# Patient Record
Sex: Male | Born: 1970 | Race: White | Hispanic: No | Marital: Married | Smoking: Never smoker
Health system: Southern US, Community
[De-identification: ages and names within clinical notes are randomized; demographics above are authoritative.]

## PROBLEM LIST (undated history)

## (undated) DIAGNOSIS — N200 Calculus of kidney: Secondary | ICD-10-CM

## (undated) DIAGNOSIS — K760 Fatty (change of) liver, not elsewhere classified: Secondary | ICD-10-CM

## (undated) DIAGNOSIS — Z87442 Personal history of urinary calculi: Secondary | ICD-10-CM

## (undated) DIAGNOSIS — G4730 Sleep apnea, unspecified: Secondary | ICD-10-CM

## (undated) DIAGNOSIS — T7840XA Allergy, unspecified, initial encounter: Secondary | ICD-10-CM

## (undated) DIAGNOSIS — F32A Depression, unspecified: Secondary | ICD-10-CM

## (undated) DIAGNOSIS — K8020 Calculus of gallbladder without cholecystitis without obstruction: Secondary | ICD-10-CM

## (undated) DIAGNOSIS — F429 Obsessive-compulsive disorder, unspecified: Secondary | ICD-10-CM

## (undated) DIAGNOSIS — R7303 Prediabetes: Secondary | ICD-10-CM

## (undated) DIAGNOSIS — K8050 Calculus of bile duct without cholangitis or cholecystitis without obstruction: Secondary | ICD-10-CM

## (undated) DIAGNOSIS — J309 Allergic rhinitis, unspecified: Secondary | ICD-10-CM

## (undated) DIAGNOSIS — G4733 Obstructive sleep apnea (adult) (pediatric): Secondary | ICD-10-CM

## (undated) DIAGNOSIS — F329 Major depressive disorder, single episode, unspecified: Secondary | ICD-10-CM

## (undated) DIAGNOSIS — F419 Anxiety disorder, unspecified: Secondary | ICD-10-CM

## (undated) DIAGNOSIS — IMO0001 Reserved for inherently not codable concepts without codable children: Secondary | ICD-10-CM

## (undated) DIAGNOSIS — E559 Vitamin D deficiency, unspecified: Secondary | ICD-10-CM

## (undated) HISTORY — DX: Allergy, unspecified, initial encounter: T78.40XA

## (undated) HISTORY — DX: Calculus of bile duct without cholangitis or cholecystitis without obstruction: K80.50

## (undated) HISTORY — DX: Allergic rhinitis, unspecified: J30.9

## (undated) HISTORY — DX: Vitamin D deficiency, unspecified: E55.9

## (undated) HISTORY — PX: LITHOTRIPSY: SUR834

## (undated) HISTORY — DX: Reserved for inherently not codable concepts without codable children: IMO0001

## (undated) HISTORY — DX: Calculus of kidney: N20.0

## (undated) HISTORY — DX: Fatty (change of) liver, not elsewhere classified: K76.0

## (undated) HISTORY — DX: Prediabetes: R73.03

## (undated) HISTORY — DX: Obstructive sleep apnea (adult) (pediatric): G47.33

## (undated) HISTORY — DX: Calculus of gallbladder without cholecystitis without obstruction: K80.20

## (undated) HISTORY — PX: INTERNAL URETHROTOMY: SHX1835

---

## 2005-07-18 ENCOUNTER — Ambulatory Visit: Payer: Self-pay | Admitting: Otolaryngology

## 2013-12-04 DIAGNOSIS — R251 Tremor, unspecified: Secondary | ICD-10-CM | POA: Insufficient documentation

## 2014-11-16 ENCOUNTER — Ambulatory Visit
Admit: 2014-11-16 | Disposition: A | Discharge status: Home / Self Care | Payer: Self-pay | Attending: Family Medicine | Admitting: Family Medicine

## 2014-11-16 LAB — URINALYSIS, COMPLETE
Bilirubin,UR: NEGATIVE
GLUCOSE, UR: NEGATIVE
Ketone: NEGATIVE
Nitrite: NEGATIVE
PROTEIN: NEGATIVE
Ph: 5.5 (ref 5.0–8.0)
Specific Gravity: 1.015 (ref 1.000–1.030)
Squamous Epithelial: NONE SEEN
WBC UR: >30 /HPF (ref 0–5)

## 2014-11-18 LAB — URINE CULTURE

## 2014-12-12 ENCOUNTER — Encounter: Payer: Self-pay | Admitting: Emergency Medicine

## 2014-12-12 ENCOUNTER — Ambulatory Visit
Admission: EM | Admit: 2014-12-12 | Discharge: 2014-12-12 | Disposition: A | Discharge status: Home / Self Care | Payer: 59 | Attending: Family Medicine | Admitting: Family Medicine

## 2014-12-12 DIAGNOSIS — Z8249 Family history of ischemic heart disease and other diseases of the circulatory system: Secondary | ICD-10-CM | POA: Diagnosis not present

## 2014-12-12 DIAGNOSIS — R312 Other microscopic hematuria: Secondary | ICD-10-CM

## 2014-12-12 DIAGNOSIS — N390 Urinary tract infection, site not specified: Secondary | ICD-10-CM

## 2014-12-12 DIAGNOSIS — Z79899 Other long term (current) drug therapy: Secondary | ICD-10-CM | POA: Insufficient documentation

## 2014-12-12 DIAGNOSIS — B3749 Other urogenital candidiasis: Secondary | ICD-10-CM

## 2014-12-12 DIAGNOSIS — M549 Dorsalgia, unspecified: Secondary | ICD-10-CM | POA: Diagnosis present

## 2014-12-12 DIAGNOSIS — R3129 Other microscopic hematuria: Secondary | ICD-10-CM

## 2014-12-12 DIAGNOSIS — R350 Frequency of micturition: Secondary | ICD-10-CM | POA: Diagnosis present

## 2014-12-12 HISTORY — DX: Depression, unspecified: F32.A

## 2014-12-12 HISTORY — DX: Obsessive-compulsive disorder, unspecified: F42.9

## 2014-12-12 HISTORY — DX: Anxiety disorder, unspecified: F41.9

## 2014-12-12 HISTORY — DX: Sleep apnea, unspecified: G47.30

## 2014-12-12 HISTORY — DX: Major depressive disorder, single episode, unspecified: F32.9

## 2014-12-12 LAB — URINALYSIS COMPLETE WITH MICROSCOPIC (ARMC ONLY)
Bilirubin Urine: NEGATIVE
Glucose, UA: NEGATIVE mg/dL
Ketones, ur: NEGATIVE mg/dL
NITRITE: POSITIVE — AB
PH: 5.5 (ref 5.0–8.0)
PROTEIN: NEGATIVE mg/dL
Specific Gravity, Urine: 1.02 (ref 1.005–1.030)

## 2014-12-12 MED ORDER — CIPROFLOXACIN HCL 500 MG PO TABS: 500.0000 mg | ORAL_TABLET | Freq: Two times a day (BID) | ORAL | Status: DC

## 2014-12-12 MED ORDER — FLUCONAZOLE 150 MG PO TABS: 150.0000 mg | ORAL_TABLET | Freq: Once | ORAL | Status: DC

## 2014-12-12 NOTE — ED Notes (Signed)
Patient c/o lower back pain and urinary urgency and frequency for a month.  Patient last dose of antibiotic was a week ago.  Patient denies fevers.  Patient denies N/V.

## 2014-12-12 NOTE — Discharge Instructions (Signed)
Antibiotic Medication Antibiotic medicine helps fight germs. Germs cause infections. This type of medicine will not work for colds, flu, or other viral infections. Tell your doctor if you:  Are allergic to any medicines.  Are pregnant or are trying to get pregnant.  Are taking other medicines.  Have other medical problems. HOME CARE  Take your medicine with a glass of water or food as told by your doctor.  Take the medicine as told. Finish them even if you start to feel better.  Do not give your medicine to other people.  Do not use your medicine in the future for a different infection.  Ask your doctor about which side effects to watch for.  Try not to miss any doses. If you miss a dose, take it as soon as possible. If it is almost time for your next dose, and your dosing schedule is:  Two doses a day, take the missed dose and the next dose 5 to 6 hours later.  Three or more doses a day, take the missed dose and the next dose 2 to 4 hours later, or double your next dose.  Then go back to your normal schedule. GET HELP RIGHT AWAY IF:   You get worse or do not get better within a few days.  The medicine makes you sick.  You develop a rash or any other side effects.  You have questions or concerns. MAKE SURE YOU:  Understand these instructions.  Will watch your condition.  Will get help right away if you are not doing well or get worse. Document Released: 05/04/2008 Document Revised: 10/18/2011 Document Reviewed: 07/01/2009 [facility] Patient Information 2015 [place], [place]. This information is not intended to replace advice given to you by your health care provider. Make sure you discuss any questions you have with your health care provider. Candida Infection A Candida infection (also called yeast, fungus, and Monilia infection) is an overgrowth of yeast that can occur anywhere on the body. A yeast infection commonly occurs in warm, moist body areas. Usually, the  infection remains localized but can spread to become a systemic infection. A yeast infection may be a sign of a more severe disease such as diabetes, leukemia, or AIDS. A yeast infection can occur in both men and women. In women, Candida vaginitis is a vaginal infection. It is one of the most common causes of vaginitis. Men usually do not have symptoms or know they have an infection until other problems develop. Men may find out they have a yeast infection because their sex partner has a yeast infection. Uncircumcised men are more likely to get a yeast infection than circumcised men. This is because the uncircumcised glans is not exposed to air and does not remain as dry as that of a circumcised glans. Older adults may develop yeast infections around dentures. CAUSES  Women Antibiotics. Steroid medication taken for a long time. Being overweight (obese). Diabetes. Poor immune condition. Certain serious medical conditions. Immune suppressive medications for organ transplant patients. Chemotherapy. Pregnancy. Menstruation. Stress and fatigue. Intravenous drug use. Oral contraceptives. Wearing tight-fitting clothes in the crotch area. Catching it from a sex partner who has a yeast infection. Spermicide. Intravenous, urinary, or other catheters. Men Catching it from a sex partner who has a yeast infection. Having oral or anal sex with a person who has the infection. Spermicide. Diabetes. Antibiotics. Poor immune system. Medications that suppress the immune system. Intravenous drug use. Intravenous, urinary, or other catheters. SYMPTOMS  Women Thick, white vaginal  discharge. Vaginal itching. Redness and swelling in and around the vagina. Irritation of the lips of the vagina and perineum. Blisters on the vaginal lips and perineum. Painful sexual intercourse. Low blood sugar (hypoglycemia). Painful urination. Bladder infections. Intestinal problems such as constipation, indigestion,  bad breath, bloating, increase in gas, diarrhea, or loose stools. Men Men may develop intestinal problems such as constipation, indigestion, bad breath, bloating, increase in gas, diarrhea, or loose stools. Dry, cracked skin on the penis with itching or discomfort. Jock itch. Dry, flaky skin. Athlete's foot. Hypoglycemia. DIAGNOSIS  Women A history and an exam are performed. The discharge may be examined under a microscope. A culture may be taken of the discharge. Men A history and an exam are performed. Any discharge from the penis or areas of cracked skin will be looked at under the microscope and cultured. Stool samples may be cultured. TREATMENT  Women Vaginal antifungal suppositories and creams. Medicated creams to decrease irritation and itching on the outside of the vagina. Warm compresses to the perineal area to decrease swelling and discomfort. Oral antifungal medications. Medicated vaginal suppositories or cream for repeated or recurrent infections. Wash and dry the irritation areas before applying the cream. Eating yogurt with Lactobacillus may help with prevention and treatment. Sometimes painting the vagina with gentian violet solution may help if creams and suppositories do not work. Men Antifungal creams and oral antifungal medications. Sometimes treatment must continue for 30 days after the symptoms go away to prevent recurrence. HOME CARE INSTRUCTIONS  Women Use cotton underwear and avoid tight-fitting clothing. Avoid colored, scented toilet paper and deodorant tampons or pads. Do not douche. Keep your diabetes under control. Finish all the prescribed medications. Keep your skin clean and dry. Consume milk or yogurt with Lactobacillus-active culture regularly. If you get frequent yeast infections and think that is what the infection is, there are over-the-counter medications that you can get. If the infection does not show healing in 3 days, talk to your  caregiver. Tell your sex partner you have a yeast infection. Your partner may need treatment also, especially if your infection does not clear up or recurs. Men Keep your skin clean and dry. Keep your diabetes under control. Finish all prescribed medications. Tell your sex partner that you have a yeast infection so he or she can be treated if necessary. SEEK MEDICAL CARE IF:  Your symptoms do not clear up or worsen in one week after treatment. You have an oral temperature above 102 F (38.9 C). You have trouble swallowing or eating for a prolonged time. You develop blisters on and around your vagina. You develop vaginal bleeding and it is not your menstrual period. You develop abdominal pain. You develop intestinal problems as mentioned above. You get weak or light-headed. You have painful or increased urination. You have pain during sexual intercourse. MAKE SURE YOU:  Understand these instructions. Will watch your condition. Will get help right away if you are not doing well or get worse. Document Released: 09/02/2004 Document Revised: 12/10/2013 Document Reviewed: 12/15/2009 [facility] Patient Information 2015 [place], [place]. This information is not intended to replace advice given to you by your health care provider. Make sure you discuss any questions you have with your health care provider. Hematuria Hematuria is blood in your urine. It can be caused by a bladder infection, kidney infection, prostate infection, kidney stone, or cancer of your urinary tract. Infections can usually be treated with medicine, and a kidney stone usually will pass through your urine. If  neither of these is the cause of your hematuria, further workup to find out the reason may be needed. It is very important that you tell your health care provider about any blood you see in your urine, even if the blood stops without treatment or happens without causing pain. Blood in your urine that happens and then stops  and then happens again can be a symptom of a very serious condition. Also, pain is not a symptom in the initial stages of many urinary cancers. HOME CARE INSTRUCTIONS  Drink lots of fluid, 3-4 quarts a day. If you have been diagnosed with an infection, cranberry juice is especially recommended, in addition to large amounts of water. Avoid caffeine, tea, and carbonated beverages because they tend to irritate the bladder. Avoid alcohol because it may irritate the prostate. Take all medicines as directed by your health care provider. If you were prescribed an antibiotic medicine, finish it all even if you start to feel better. If you have been diagnosed with a kidney stone, follow your health care provider's instructions regarding straining your urine to catch the stone. Empty your bladder often. Avoid holding urine for long periods of time. After a bowel movement, women should cleanse front to back. Use each tissue only once. Empty your bladder before and after sexual intercourse if you are a male. SEEK MEDICAL CARE IF: You develop back pain. You have a fever. You have a feeling of sickness in your stomach (nausea) or vomiting. Your symptoms are not better in 3 days. Return sooner if you are getting worse. SEEK IMMEDIATE MEDICAL CARE IF:  You develop severe vomiting and are unable to keep the medicine down. You develop severe back or abdominal pain despite taking your medicines. You begin passing a large amount of blood or clots in your urine. You feel extremely weak or faint, or you pass out. MAKE SURE YOU:  Understand these instructions. Will watch your condition. Will get help right away if you are not doing well or get worse. Document Released: 07/26/2005 Document Revised: 12/10/2013 Document Reviewed: 03/26/2013 [facility] Patient Information 2015 [place], [place]. This information is not intended to replace advice given to you by your health care provider. Make sure you discuss any  questions you have with your health care provider.

## 2014-12-12 NOTE — ED Provider Notes (Signed)
CSN: [id number]     Arrival date & time 12/12/14  0815 History   First MD Initiated Contact with Patient 12/12/14 [phone]     Chief Complaint  Patient presents with  . Back Pain  . Urinary Frequency   (Consider location/radiation/quality/duration/timing/severity/associated sxs/prior Treatment) Patient is a 44 y.o. male presenting with back pain and frequency. The history is provided by the patient. No language interpreter was used.  Back Pain Quality:  Aching Radiates to:  Does not radiate Pain is:  Same all the time Onset quality:  Unable to specify Progression:  Worsening Relieved by:  Nothing Associated symptoms: dysuria   Urinary Frequency This is a recurrent problem. The current episode started more than 1 week ago. The problem has not changed since onset.Nothing relieves the symptoms. He has tried nothing for the symptoms. The treatment provided no relief.    Patient was treated w/antibiotic earlier last month. He states it worked for a few days and then it returned.  He has had a HX of recurrent kidney stones and is iunder a Insurance underwriterUrologist care at this time . He has had multiple CT scans in the past as well.  Past Medical History  Diagnosis Date  . Anxiety   . Depression   . OCD (obsessive compulsive disorder)   . Sleep apnea   . Kidney stones    History reviewed. No pertinent past surgical history. Family History  Problem Relation Age of Onset  . Osteoarthritis Mother   . Hyperlipidemia Father   . Hypertension Father   . Osteoarthritis Father    History  Substance Use Topics  . Smoking status: Never Smoker   . Smokeless tobacco: Never Used  . Alcohol Use: Yes    Review of Systems  Genitourinary: Positive for dysuria, urgency, frequency, hematuria and difficulty urinating.  Musculoskeletal: Positive for back pain.  Skin: Negative for color change.    Allergies  Septra  Home Medications   Prior to Admission medications   Medication Sig Start Date End Date Taking?  Authorizing Provider  ARIPiprazole (ABILIFY) 10 MG tablet Take 10 mg by mouth daily.   Yes Historical Provider, MD  cetirizine (ZYRTEC) 10 MG tablet Take 10 mg by mouth daily.   Yes Historical Provider, MD  clonazePAM (KLONOPIN) 1 MG tablet Take 1 mg by mouth 2 (two) times daily.   Yes Historical Provider, MD  fluvoxaMINE (LUVOX) 100 MG tablet Take 100 mg by mouth 2 (two) times daily.   Yes Historical Provider, MD  hydrochlorothiazide (HYDRODIURIL) 12.5 MG tablet Take 12.5 mg by mouth daily.   Yes Historical Provider, MD  nortriptyline (PAMELOR) 25 MG capsule Take 25 mg by mouth 2 (two) times daily.   Yes Historical Provider, MD  ciprofloxacin (CIPRO) 500 MG tablet Take 1 tablet (500 mg total) by mouth 2 (two) times daily. 12/12/14   Hassan RowanEugene Courtlynn Holloman, MD  ciprofloxacin (CIPRO) 500 MG tablet Take 1 tablet (500 mg total) by mouth 2 (two) times daily. 12/12/14   Hassan RowanEugene Zurii Hewes, MD  fluconazole (DIFLUCAN) 150 MG tablet Take 1 tablet (150 mg total) by mouth once. Repeat in 1 week 12/12/14   Hassan RowanEugene Abygayle Deltoro, MD  fluconazole (DIFLUCAN) 150 MG tablet Take 1 tablet (150 mg total) by mouth once. Repeat in 1 week 12/12/14   Hassan RowanEugene Dorathy Stallone, MD   BP 101/70 mmHg  Pulse 110  Temp(Src) 98.5 F (36.9 C) (Tympanic)  Resp 16  Ht 5\' 10"  (1.778 m)  Wt 256 lb (116.121 kg)  BMI 36.73 kg/m2  SpO2 99% Physical Exam  Constitutional: He is oriented to person, place, and time. He appears well-developed and well-nourished. No distress.  Neck: Neck supple.  Musculoskeletal: Normal range of motion. He exhibits no tenderness.  No CVA tenderness noted.  Neurological: He is alert and oriented to person, place, and time.  Skin: Skin is warm. No rash noted. He is not diaphoretic. No erythema.  Psychiatric: He has a normal mood and affect.  Vitals reviewed.  ED Course  Procedures (including critical care time) Labs Review Labs Reviewed  URINALYSIS COMPLETEWITH MICROSCOPIC [facility])  - Abnormal; Notable for the following:    APPearance HAZY  (*)    Hgb urine dipstick 3+ (*)    Nitrite POSITIVE (*)    Leukocytes, UA 2+ (*)    Bacteria, UA MANY (*)    Squamous Epithelial / LPF 0-5 (*)    All other components within normal limits  URINE CULTURE    Imaging Review No results found. Results for orders placed or performed during the hospital encounter of 12/12/14  Urinalysis complete, with microscopic  Result Value Ref Range   Color, Urine YELLOW YELLOW   APPearance HAZY (A) CLEAR   Glucose, UA NEGATIVE NEGATIVE mg/dL   Bilirubin Urine NEGATIVE NEGATIVE   Ketones, ur NEGATIVE NEGATIVE mg/dL   Specific Gravity, Urine 1.020 1.005 - 1.030   Hgb urine dipstick 3+ (A) NEGATIVE   pH 5.5 5.0 - 8.0   Protein, ur NEGATIVE NEGATIVE mg/dL   Nitrite POSITIVE (A) NEGATIVE   Leukocytes, UA 2+ (A) NEGATIVE   RBC / HPF 6-30 <3 RBC/hpf   WBC, UA 6-30 <3 WBC/hpf   Bacteria, UA MANY (A) RARE   Squamous Epithelial / LPF 0-5 (A) RARE   Budding Yeast PRESENT    WBC Casts, UA PRESENT    Urine also has yeast present as well.  MDM   1. Acute urinary tract infection   2. Hematuria, microscopic   3. Candidal urinary tract infection        Hassan RowanEugene Delwyn Scoggin, MD 12/12/14 1715

## 2014-12-14 LAB — URINE CULTURE: Special Requests: NORMAL

## 2015-07-20 ENCOUNTER — Ambulatory Visit
Admission: EM | Admit: 2015-07-20 | Discharge: 2015-07-20 | Disposition: A | Discharge status: Home / Self Care | Payer: 59 | Attending: Family Medicine | Admitting: Family Medicine

## 2015-07-20 DIAGNOSIS — J069 Acute upper respiratory infection, unspecified: Secondary | ICD-10-CM

## 2015-07-20 DIAGNOSIS — J40 Bronchitis, not specified as acute or chronic: Secondary | ICD-10-CM | POA: Diagnosis not present

## 2015-07-20 MED ORDER — HYDROCOD POLST-CPM POLST ER 10-8 MG/5ML PO SUER: 5.0000 mL | Freq: Two times a day (BID) | ORAL | Status: DC | PRN

## 2015-07-20 MED ORDER — FEXOFENADINE-PSEUDOEPHED ER 180-240 MG PO TB24: 1.0000 | ORAL_TABLET | Freq: Every day | ORAL | Status: DC

## 2015-07-20 MED ORDER — AZITHROMYCIN 250 MG PO TABS: ORAL_TABLET | ORAL | Status: DC

## 2015-07-20 NOTE — Discharge Instructions (Signed)
Cough, Adult °Coughing is a reflex that clears your throat and your airways. Coughing helps to heal and protect your lungs. It is normal to cough occasionally, but a cough that happens with other symptoms or lasts a long time may be a sign of a condition that needs treatment. A cough may last only 2-3 weeks (acute), or it may last longer than 8 weeks (chronic). °CAUSES °Coughing is commonly caused by: °· Breathing in substances that irritate your lungs. °· A viral or bacterial respiratory infection. °· Allergies. °· Asthma. °· Postnasal drip. °· Smoking. °· Acid backing up from the stomach into the esophagus (gastroesophageal reflux). °· Certain medicines. °· Chronic lung problems, including COPD (or rarely, lung cancer). °· Other medical conditions such as heart failure. °HOME CARE INSTRUCTIONS  °Pay attention to any changes in your symptoms. Take these actions to help with your discomfort: °· Take medicines only as told by your health care provider. °· If you were prescribed an antibiotic medicine, take it as told by your health care provider. Do not stop taking the antibiotic even if you start to feel better. °· Talk with your health care provider before you take a cough suppressant medicine. °· Drink enough fluid to keep your urine clear or pale yellow. °· If the air is dry, use a cold steam vaporizer or humidifier in your bedroom or your home to help loosen secretions. °· Avoid anything that causes you to cough at work or at home. °· If your cough is worse at night, try sleeping in a semi-upright position. °· Avoid cigarette smoke. If you smoke, quit smoking. If you need help quitting, ask your health care provider. °· Avoid caffeine. °· Avoid alcohol. °· Rest as needed. °SEEK MEDICAL CARE IF:  °· You have new symptoms. °· You cough up pus. °· Your cough does not get better after 2-3 weeks, or your cough gets worse. °· You cannot control your cough with suppressant medicines and you are losing sleep. °· You  develop pain that is getting worse or pain that is not controlled with pain medicines. °· You have a fever. °· You have unexplained weight loss. °· You have night sweats. °SEEK IMMEDIATE MEDICAL CARE IF: °· You cough up blood. °· You have difficulty breathing. °· Your heartbeat is very fast. °  °This information is not intended to replace advice given to you by your health care provider. Make sure you discuss any questions you have with your health care provider. °  °Document Released: 01/22/2011 Document Revised: 04/16/2015 Document Reviewed: 10/02/2014 °Elsevier Interactive Patient Education ©2016 [employer] ° °Upper Respiratory Infection, Adult °Most upper respiratory infections (URIs) are caused by a virus. A URI affects the nose, throat, and upper air passages. The most common type of URI is often called "the common cold." °HOME CARE  °· Take medicines only as told by your doctor. °· Gargle warm saltwater or take cough drops to comfort your throat as told by your doctor. °· Use a warm mist humidifier or inhale steam from a shower to increase air moisture. This may make it easier to breathe. °· Drink enough fluid to keep your pee (urine) clear or pale yellow. °· Eat soups and other clear broths. °· Have a healthy diet. °· Rest as needed. °· Go back to work when your fever is gone or your doctor says it is okay. °¨ You may need to stay home longer to avoid giving your URI to others. °¨ You can also wear a   face mask and wash your hands often to prevent spread of the virus. °· Use your inhaler more if you have asthma. °· Do not use any tobacco products, including cigarettes, chewing tobacco, or electronic cigarettes. If you need help quitting, ask your doctor. °GET HELP IF: °· You are getting worse, not better. °· Your symptoms are not helped by medicine. °· You have chills. °· You are getting more short of breath. °· You have brown or red mucus. °· You have yellow or brown discharge from your nose. °· You have  pain in your face, especially when you bend forward. °· You have a fever. °· You have puffy (swollen) neck glands. °· You have pain while swallowing. °· You have white areas in the back of your throat. °GET HELP RIGHT AWAY IF:  °· You have very bad or constant: °¨ Headache. °¨ Ear pain. °¨ Pain in your forehead, behind your eyes, and over your cheekbones (sinus pain). °¨ Chest pain. °· You have long-lasting (chronic) lung disease and any of the following: °¨ Wheezing. °¨ Long-lasting cough. °¨ Coughing up blood. °¨ A change in your usual mucus. °· You have a stiff neck. °· You have changes in your: °¨ Vision. °¨ Hearing. °¨ Thinking. °¨ Mood. °MAKE SURE YOU:  °· Understand these instructions. °· Will watch your condition. °· Will get help right away if you are not doing well or get worse. °  °This information is not intended to replace advice given to you by your health care provider. Make sure you discuss any questions you have with your health care provider. °  °Document Released: 01/12/2008 Document Revised: 12/10/2014 Document Reviewed: 10/31/2013 °Elsevier Interactive Patient Education ©2016 [employer]. ° °

## 2015-07-20 NOTE — ED Provider Notes (Signed)
CSN: [id number]     Arrival date & time 07/20/15  1017 History   First MD Initiated Contact with Patient 07/20/15 1205     Nurses notes were reviewed. Chief Complaint  Patient presents with  . Cough   Patient reports right just Thanksgiving having cough and congestion. States is getting worse. He also states that he has a history of bronchial asthma. He feels asked what he starting to develop now. States the sputum is greenish-grey and  Slimy.denies any bronchospasm at this time. Nasal congestion is also present and URI symptoms. He is allergic to Septra. He does not smoke.  (Consider location/radiation/quality/duration/timing/severity/associated sxs/prior Treatment) Patient is a 44 y.o. male presenting with cough. The history is provided by the patient. No language interpreter was used.  Cough Cough characteristics:  Productive and hacking Sputum characteristics:  Wallace CullensGray and green Severity:  Moderate Duration: About 2 weeks. Timing:  Constant Progression:  Worsening Chronicity:  New Smoker: no   Context: upper respiratory infection   Context: not animal exposure, not exposure to allergens, not fumes, not occupational exposure, not sick contacts, not smoke exposure and not weather changes   Relieved by:  Nothing Ineffective treatments:  Cough suppressants Associated symptoms: rhinorrhea   Associated symptoms: no chest pain, no chills, no diaphoresis, no ear fullness, no fever, no rash, no shortness of breath, no sore throat and no wheezing     Past Medical History  Diagnosis Date  . Anxiety   . Depression   . OCD (obsessive compulsive disorder)   . Sleep apnea   . Kidney stones    Past Surgical History  Procedure Laterality Date  . Lithotripsy     Family History  Problem Relation Age of Onset  . Osteoarthritis Mother   . Hyperlipidemia Father   . Hypertension Father   . Osteoarthritis Father    Social History  Substance Use Topics  . Smoking status: Never Smoker   .  Smokeless tobacco: Never Used  . Alcohol Use: 0.0 oz/week    0 Standard drinks or equivalent per week     Comment: rarely    Review of Systems  Constitutional: Negative for fever, chills and diaphoresis.  HENT: Positive for rhinorrhea. Negative for sore throat.   Respiratory: Positive for cough. Negative for shortness of breath and wheezing.   Cardiovascular: Negative for chest pain.  Skin: Negative for rash.  All other systems reviewed and are negative.   Allergies  Septra  Home Medications   Prior to Admission medications   Medication Sig Start Date End Date Taking? Authorizing Provider  aspirin 81 MG tablet Take 81 mg by mouth daily.   Yes Historical Provider, MD  Brexpiprazole (REXULTI) 1 MG TABS Take 1 tablet by mouth daily.   Yes Historical Provider, MD  cetirizine (ZYRTEC) 10 MG tablet Take 10 mg by mouth daily.   Yes Historical Provider, MD  clonazePAM (KLONOPIN) 1 MG tablet Take 1 mg by mouth 2 (two) times daily.   Yes Historical Provider, MD  fluvoxaMINE (LUVOX) 100 MG tablet Take 100 mg by mouth 2 (two) times daily.   Yes Historical Provider, MD  ARIPiprazole (ABILIFY) 10 MG tablet Take 10 mg by mouth daily.    Historical Provider, MD  azithromycin (ZITHROMAX Z-PAK) 250 MG tablet Take 2 tablets first day and then 1 po a day for 4 days 07/20/15   Hassan RowanEugene Lamia Mariner, MD  chlorpheniramine-HYDROcodone [facility](TUSSIONEX PENNKINETIC ER) 10-8 MG/5ML SUER Take 5 mLs by mouth every 12 (twelve) hours as needed  for cough. 07/20/15   Hassan Rowan, MD  ciprofloxacin (CIPRO) 500 MG tablet Take 1 tablet (500 mg total) by mouth 2 (two) times daily. 12/12/14   Hassan Rowan, MD  ciprofloxacin (CIPRO) 500 MG tablet Take 1 tablet (500 mg total) by mouth 2 (two) times daily. 12/12/14   Hassan Rowan, MD  fexofenadine-pseudoephedrine (ALLEGRA-D ALLERGY & CONGESTION) 180-240 MG 24 hr tablet Take 1 tablet by mouth daily. 07/20/15   Hassan Rowan, MD  fluconazole (DIFLUCAN) 150 MG tablet Take 1 tablet (150 mg total) by  mouth once. Repeat in 1 week 12/12/14   Hassan Rowan, MD  fluconazole (DIFLUCAN) 150 MG tablet Take 1 tablet (150 mg total) by mouth once. Repeat in 1 week 12/12/14   Hassan Rowan, MD  hydrochlorothiazide (HYDRODIURIL) 12.5 MG tablet Take 12.5 mg by mouth daily.    Historical Provider, MD  nortriptyline (PAMELOR) 25 MG capsule Take 25 mg by mouth 2 (two) times daily.    Historical Provider, MD   Meds Ordered and Administered this Visit  Medications - No data to display  BP 111/69 mmHg  Pulse 100  Temp(Src) 98.4 F (36.9 C)  Resp 17  Ht  (1.778 m)  Wt 255 lb (115.667 kg)  BMI 36.59 kg/m2  SpO2 100% No data found.   Physical Exam  Constitutional: He is oriented to person, place, and time. He appears well-developed.  HENT:  Head: Normocephalic and atraumatic.  Eyes: Pupils are equal, round, and reactive to light.  Neck: Normal range of motion. Neck supple.  Cardiovascular: Normal rate, regular rhythm and normal heart sounds.   Pulmonary/Chest: Effort normal.  Musculoskeletal: Normal range of motion.  Neurological: He is alert and oriented to person, place, and time.  Skin: Skin is warm and dry.  Vitals reviewed.   ED Course  Procedures (including critical care time)  Labs Review Labs Reviewed - No data to display  Imaging Review No results found.   Visual Acuity Review  Right Eye Distance:   Left Eye Distance:   Bilateral Distance:    Right Eye Near:   Left Eye Near:    Bilateral Near:         MDM   1. Bronchitis   2. URI, acute      Patient with history of recurrent bronchitis. We'll place on Z-Pak. Tussionex 1 teaspoon twice a day. Since she's not having any wheezing at this time we'll hold off on albuterol inhaler and placement Allegra-D and work note for tomorrow.       Hassan Rowan, MD 07/20/15 1255

## 2015-07-20 NOTE — ED Notes (Signed)
Patient complains of cough with productivity. He states that this started around 1-2 weeks ago and has been worsening. He states that he has had sinus pain and pressure with congestion and drainage.

## 2015-08-08 ENCOUNTER — Encounter: Payer: Self-pay | Admitting: Family Medicine

## 2015-08-08 ENCOUNTER — Ambulatory Visit (INDEPENDENT_AMBULATORY_CARE_PROVIDER_SITE_OTHER): Payer: 59 | Admitting: Family Medicine

## 2015-08-08 VITALS — BP 124/82 | HR 64 | Ht 70.0 in | Wt 257.0 lb

## 2015-08-08 DIAGNOSIS — J309 Allergic rhinitis, unspecified: Secondary | ICD-10-CM | POA: Diagnosis not present

## 2015-08-08 DIAGNOSIS — E559 Vitamin D deficiency, unspecified: Secondary | ICD-10-CM

## 2015-08-08 DIAGNOSIS — N200 Calculus of kidney: Secondary | ICD-10-CM

## 2015-08-08 DIAGNOSIS — Z23 Encounter for immunization: Secondary | ICD-10-CM | POA: Diagnosis not present

## 2015-08-08 DIAGNOSIS — G4733 Obstructive sleep apnea (adult) (pediatric): Secondary | ICD-10-CM | POA: Insufficient documentation

## 2015-08-08 DIAGNOSIS — R7303 Prediabetes: Secondary | ICD-10-CM

## 2015-08-08 DIAGNOSIS — F429 Obsessive-compulsive disorder, unspecified: Secondary | ICD-10-CM | POA: Insufficient documentation

## 2015-08-08 DIAGNOSIS — R748 Abnormal levels of other serum enzymes: Secondary | ICD-10-CM

## 2015-08-08 DIAGNOSIS — E669 Obesity, unspecified: Secondary | ICD-10-CM | POA: Insufficient documentation

## 2015-08-08 DIAGNOSIS — IMO0001 Reserved for inherently not codable concepts without codable children: Secondary | ICD-10-CM | POA: Insufficient documentation

## 2015-08-08 HISTORY — DX: Calculus of kidney: N20.0

## 2015-08-08 HISTORY — DX: Reserved for inherently not codable concepts without codable children: IMO0001

## 2015-08-08 HISTORY — DX: Prediabetes: R73.03

## 2015-08-08 HISTORY — DX: Obstructive sleep apnea (adult) (pediatric): G47.33

## 2015-08-08 HISTORY — DX: Allergic rhinitis, unspecified: J30.9

## 2015-08-08 NOTE — Progress Notes (Signed)
Date:  08/08/2015   Name:  Richard Frost   DOB:  12/28/1970   MRN:  [id number]  PCP:  No primary care provider on file.    Chief Complaint: Establish Care   History of Present Illness:  This is a 44 y.o. male with OCD followed by Dr. Percell BeltMillet in [place], recently changed from Abilify to Rexulti. Also followed by Dr. Elenore RotaJuengel for OSA on BiPAP. Weight stable, gets no regular exercise, blood work several months ago showed prediabetes and HLD. Takes asa because recommended at urgent care, no known CV dz. AR well controlled on cetrizine. Father with HTN, mother with OA, sister with HTN/Etoh. Hx kidney stones requiring multiple  lithos, on HCTZ in past, off x 1 yr without recurrence. Tetanus imm 2-3 yrs ago, no flu imm this year.  Review of Systems:  Review of Systems  Constitutional: Negative for fever and fatigue.  HENT: Negative for ear pain, sore throat and trouble swallowing.   Eyes: Negative for pain.  Respiratory: Negative for shortness of breath.   Cardiovascular: Negative for chest pain and leg swelling.  Gastrointestinal: Negative for abdominal pain.  Endocrine: Negative for polyuria.  Genitourinary: Negative for difficulty urinating.  Neurological: Negative for syncope and light-headedness.    Patient Active Problem List   Diagnosis Date Noted  . OCD (obsessive compulsive disorder) 08/08/2015  . Allergic rhinitis 08/08/2015    Prior to Admission medications   Medication Sig Start Date End Date Taking? Authorizing Provider  aspirin 81 MG tablet Take 81 mg by mouth daily.   Yes Historical Provider, MD  Brexpiprazole (REXULTI) 1 MG TABS Take 1 tablet by mouth at bedtime. Dr Percell BeltMillet   Yes Historical Provider, MD  cetirizine (ZYRTEC) 10 MG tablet Take 10 mg by mouth daily.   Yes Historical Provider, MD  clonazePAM (KLONOPIN) 2 MG tablet Take 2 mg by mouth 2 (two) times daily. Dr Percell BeltMillet   Yes Historical Provider, MD  fluvoxaMINE (LUVOX) 100 MG tablet Take 100 mg by mouth 2  (two) times daily. Dr Percell BeltMillet   Yes Historical Provider, MD    Allergies  Allergen Reactions  . Sulfa Antibiotics     Past Surgical History  Procedure Laterality Date  . Lithotripsy      x 7  . Internal urethrotomy      remove kidney stone    Social History  Substance Use Topics  . Smoking status: Never Smoker   . Smokeless tobacco: None  . Alcohol Use: No    Family History  Problem Relation Age of Onset  . Hypertension Father   . Hypertension Sister   . Cancer Maternal Aunt   . Diabetes Paternal Aunt   . Diabetes Paternal Grandmother     Medication list has been reviewed and updated.  Physical Examination: BP 124/82 mmHg  Pulse 64  Ht 5\' 10"  (1.778 m)  Wt 257 lb (116.574 kg)  BMI 36.88 kg/m2  Physical Exam  Constitutional: He is oriented to person, place, and time. He appears well-developed and well-nourished.  HENT:  Head: Normocephalic and atraumatic.  Right Ear: External ear normal.  Left Ear: External ear normal.  Nose: Nose normal.  Mouth/Throat: Oropharynx is clear and moist.  TM's clear  Eyes: Conjunctivae and EOM are normal. Pupils are equal, round, and reactive to light. No scleral icterus.  Neck: Neck supple. No thyromegaly present.  Cardiovascular: Normal rate, regular rhythm and normal heart sounds.   Pulmonary/Chest: Effort normal and breath sounds normal.  Abdominal: Soft. He  exhibits no distension and no mass. There is no tenderness.  Musculoskeletal: He exhibits no edema.  Lymphadenopathy:    He has no cervical adenopathy.  Neurological: He is alert and oriented to person, place, and time. Coordination normal.  Mild facial tic  Skin: Skin is warm and dry.  Psychiatric: He has a normal mood and affect. His behavior is normal.  Nursing note and vitals reviewed.   Assessment and Plan:  1. Obesity, Class II, BMI 35-39.9, with comorbidity (HCC) Discussed exercise (150 mins/wk) and weight loss - TSH - Comprehensive metabolic panel -  Lipid Profile - Vitamin D (25 hydroxy)  2. OSA treated with BiPAP Followed by ENT - CBC  3. Prediabetes Likely weight related - HgB A1c  4. Nephrolithiasis No recurrence off HCTZ  5. OCD (obsessive compulsive disorder) Followed by psych  6. Allergic rhinitis, unspecified allergic rhinitis type Well controlled on cetrizine  7. Need for influenza vaccination - Flu Vaccine QUAD 36+ mos PF IM (Fluarix & Fluzone Quad PF)  Return in about 4 weeks (around 09/05/2015).  Dionne Ano. Kingsley Spittle MD [facility]  08/08/2015

## 2015-08-09 LAB — TSH: TSH: 2.15 u[IU]/mL (ref 0.450–4.500)

## 2015-08-09 LAB — CBC
HEMATOCRIT: 42.3 % (ref 37.5–51.0)
HEMOGLOBIN: 14.2 g/dL (ref 12.6–17.7)
MCH: 30.5 pg (ref 26.6–33.0)
MCHC: 33.6 g/dL (ref 31.5–35.7)
MCV: 91 fL (ref 79–97)
Platelets: 273 10*3/uL (ref 150–379)
RBC: 4.65 x10E6/uL (ref 4.14–5.80)
RDW: 14.2 % (ref 12.3–15.4)
WBC: 5.4 10*3/uL (ref 3.4–10.8)

## 2015-08-09 LAB — COMPREHENSIVE METABOLIC PANEL
ALK PHOS: 89 IU/L (ref 39–117)
ALT: 99 IU/L — AB (ref 0–44)
AST: 76 IU/L — ABNORMAL HIGH (ref 0–40)
Albumin/Globulin Ratio: 1.7 (ref 1.1–2.5)
Albumin: 4.5 g/dL (ref 3.5–5.5)
BILIRUBIN TOTAL: 0.5 mg/dL (ref 0.0–1.2)
BUN/Creatinine Ratio: 11 (ref 9–20)
BUN: 10 mg/dL (ref 6–24)
CHLORIDE: 102 mmol/L (ref 96–106)
CO2: 23 mmol/L (ref 18–29)
Calcium: 9.2 mg/dL (ref 8.7–10.2)
Creatinine, Ser: 0.95 mg/dL (ref 0.76–1.27)
GFR calc Af Amer: 111 mL/min/{1.73_m2} (ref >59)
GFR calc non Af Amer: 96 mL/min/{1.73_m2} (ref >59)
GLUCOSE: 80 mg/dL (ref 65–99)
Globulin, Total: 2.6 g/dL (ref 1.5–4.5)
POTASSIUM: 4.1 mmol/L (ref 3.5–5.2)
Sodium: 144 mmol/L (ref 134–144)
Total Protein: 7.1 g/dL (ref 6.0–8.5)

## 2015-08-09 LAB — LIPID PANEL
CHOLESTEROL TOTAL: 173 mg/dL (ref 100–199)
Chol/HDL Ratio: 4.2 ratio units (ref 0.0–5.0)
HDL: 41 mg/dL (ref >39)
LDL Calculated: 102 mg/dL — ABNORMAL HIGH (ref 0–99)
Triglycerides: 151 mg/dL — ABNORMAL HIGH (ref 0–149)
VLDL CHOLESTEROL CAL: 30 mg/dL (ref 5–40)

## 2015-08-09 LAB — VITAMIN D 25 HYDROXY (VIT D DEFICIENCY, FRACTURES): Vit D, 25-Hydroxy: 12.3 ng/mL — ABNORMAL LOW (ref 30.0–100.0)

## 2015-08-09 LAB — HEMOGLOBIN A1C
ESTIMATED AVERAGE GLUCOSE: 114 mg/dL
HEMOGLOBIN A1C: 5.6 % (ref 4.8–5.6)

## 2015-08-11 DIAGNOSIS — E559 Vitamin D deficiency, unspecified: Secondary | ICD-10-CM

## 2015-08-11 HISTORY — DX: Vitamin D deficiency, unspecified: E55.9

## 2015-08-11 MED ORDER — VITAMIN D 50 MCG (2000 UT) PO CAPS: 1.0000 | ORAL_CAPSULE | Freq: Every day | ORAL | Status: DC

## 2015-08-11 NOTE — Addendum Note (Signed)
Addended by: Schuyler AmorPLONK, Kentrell Guettler on: 08/11/2015 08:48 AM   Modules accepted: Orders

## 2015-08-12 ENCOUNTER — Other Ambulatory Visit: Payer: Self-pay

## 2015-08-14 ENCOUNTER — Encounter: Payer: Self-pay | Admitting: Family Medicine

## 2015-08-15 ENCOUNTER — Telehealth: Payer: Self-pay

## 2015-08-15 NOTE — Telephone Encounter (Signed)
OK 

## 2015-08-15 NOTE — Telephone Encounter (Signed)
Sent to Plonk 

## 2015-08-18 ENCOUNTER — Ambulatory Visit: Admission: RE | Admit: 2015-08-18 | Payer: 59 | Source: Ambulatory Visit

## 2015-08-19 ENCOUNTER — Telehealth: Payer: Self-pay

## 2015-08-19 ENCOUNTER — Ambulatory Visit
Admission: RE | Admit: 2015-08-19 | Discharge: 2015-08-19 | Disposition: A | Discharge status: Home / Self Care | Payer: 59 | Source: Ambulatory Visit | Attending: Family Medicine | Admitting: Family Medicine

## 2015-08-19 DIAGNOSIS — K760 Fatty (change of) liver, not elsewhere classified: Secondary | ICD-10-CM

## 2015-08-19 DIAGNOSIS — K8020 Calculus of gallbladder without cholecystitis without obstruction: Secondary | ICD-10-CM | POA: Diagnosis not present

## 2015-08-19 DIAGNOSIS — R748 Abnormal levels of other serum enzymes: Secondary | ICD-10-CM | POA: Insufficient documentation

## 2015-08-19 HISTORY — DX: Calculus of gallbladder without cholecystitis without obstruction: K80.20

## 2015-08-19 HISTORY — DX: Fatty (change of) liver, not elsewhere classified: K76.0

## 2015-08-19 NOTE — Telephone Encounter (Signed)
Spoke with pt. Pt. Advised of results. Patient verbalized understanding. [facility]

## 2015-08-19 NOTE — Telephone Encounter (Signed)
-----   Message from Schuyler AmorWilliam Plonk, MD sent at 08/19/2015 12:28 PM EST ----- Inform liver US shows gallstones (no treatment needed unless develops abdominal pain after eating fatty/fried foods) and fatty liver (will monitor, would improve with weight loss).

## 2015-09-04 ENCOUNTER — Encounter: Payer: Self-pay | Admitting: Family Medicine

## 2015-09-04 ENCOUNTER — Ambulatory Visit (INDEPENDENT_AMBULATORY_CARE_PROVIDER_SITE_OTHER): Payer: 59 | Admitting: Family Medicine

## 2015-09-04 VITALS — BP 110/78 | HR 78 | Temp 98.1°F | Resp 16 | Ht 70.5 in | Wt 255.6 lb

## 2015-09-04 DIAGNOSIS — K760 Fatty (change of) liver, not elsewhere classified: Secondary | ICD-10-CM | POA: Diagnosis not present

## 2015-09-04 DIAGNOSIS — IMO0001 Reserved for inherently not codable concepts without codable children: Secondary | ICD-10-CM

## 2015-09-04 DIAGNOSIS — H6981 Other specified disorders of Eustachian tube, right ear: Secondary | ICD-10-CM | POA: Diagnosis not present

## 2015-09-04 DIAGNOSIS — J069 Acute upper respiratory infection, unspecified: Secondary | ICD-10-CM | POA: Diagnosis not present

## 2015-09-04 DIAGNOSIS — K8020 Calculus of gallbladder without cholecystitis without obstruction: Secondary | ICD-10-CM

## 2015-09-04 DIAGNOSIS — E559 Vitamin D deficiency, unspecified: Secondary | ICD-10-CM | POA: Diagnosis not present

## 2015-09-04 NOTE — Progress Notes (Signed)
Date:  09/04/2015   Name:  Richard Frost   DOB:  14-Aug-1970   MRN:  [id number]  PCP:  [name], MD    Chief Complaint: Hypertension; Ear Pain; and Sore Throat   History of Present Illness:  This is a 45 y.o. male for f/u blood work, also c/o 1 wk hx rhinorrhea, post-nasal drip, sore throat, and dull R ear pain. Lab work showed low vit D and elevated liver enzymes, f/u US showed gallstones and fatty liver. Taking vit D 4000 units qod.  Review of Systems:  Review of Systems  Constitutional: Negative for fever.  Eyes: Negative for pain.  Respiratory: Negative for cough.   Cardiovascular: Negative for chest pain and leg swelling.  Endocrine: Negative for polyuria.  Genitourinary: Negative for difficulty urinating.  Neurological: Negative for syncope and light-headedness.    Patient Active Problem List   Diagnosis Date Noted  . Gallstones 08/19/2015  . Fatty liver disease, nonalcoholic 08/19/2015  . Vitamin D deficiency 08/11/2015  . OCD (obsessive compulsive disorder) 08/08/2015  . Allergic rhinitis 08/08/2015  . Nephrolithiasis 08/08/2015  . Prediabetes 08/08/2015  . OSA treated with BiPAP 08/08/2015  . Obesity, Class II, BMI 35-39.9, with comorbidity (HCC) 08/08/2015    Prior to Admission medications   Medication Sig Start Date End Date Taking? Authorizing Provider  aspirin 81 MG tablet Take 81 mg by mouth daily.   Yes Historical Provider, MD  Brexpiprazole (REXULTI) 1 MG TABS Take 1 tablet by mouth daily.   Yes Historical Provider, MD  cetirizine (ZYRTEC) 10 MG tablet Take 10 mg by mouth daily.   Yes Historical Provider, MD  Cholecalciferol (VITAMIN D) 2000 units CAPS Take 1 capsule (2,000 Units total) by mouth daily. Patient taking differently: Take 2 capsules by mouth every other day.  08/11/15  Yes [name], MD  clonazePAM (KLONOPIN) 1 MG tablet Take 2 mg by mouth at bedtime.    Yes Historical Provider, MD  fluvoxaMINE (LUVOX) 100 MG tablet Take 100 mg by  mouth 2 (two) times daily. Dr Percell Belt   Yes Historical Provider, MD    Allergies  Allergen Reactions  . Septra [Sulfamethoxazole-Trimethoprim] Hives  . Sulfa Antibiotics     Past Surgical History  Procedure Laterality Date  . Lithotripsy    . Lithotripsy      x 7  . Internal urethrotomy      remove kidney stone    Social History  Substance Use Topics  . Smoking status: Never Smoker   . Smokeless tobacco: None  . Alcohol Use: No     Comment: rarely    Family History  Problem Relation Age of Onset  . Osteoarthritis Mother   . Hyperlipidemia Father   . Osteoarthritis Father   . Hypertension Father   . Hypertension Sister   . Cancer Maternal Aunt   . Diabetes Paternal Aunt   . Diabetes Paternal Grandmother     Medication list has been reviewed and updated.  Physical Examination: BP 110/78 mmHg  Pulse 78  Temp(Src) 98.1 F (36.7 C)  Resp 16  Ht 5' 10.5" (1.791 m)  Wt 255 lb 9.6 oz (115.939 kg)  BMI 36.14 kg/m2  Physical Exam  Constitutional: He appears well-developed and well-nourished.  HENT:  Right Ear: External ear normal.  Left Ear: External ear normal.  R TM retracted  Neck: Neck supple.  Cardiovascular: Normal rate, regular rhythm and normal heart sounds.   Pulmonary/Chest: Effort normal and breath sounds normal.  Musculoskeletal: He exhibits  no edema.  Lymphadenopathy:    He has no cervical adenopathy.  Neurological: He is alert.  Skin: Skin is warm and dry.  Psychiatric: He has a normal mood and affect. His behavior is normal.  Nursing note and vitals reviewed.   Assessment and Plan:  1. Upper respiratory infection Sx rx expect resolution over weekend, call if sxs worsen/persist  2. ETD (eustachian tube dysfunction), right Sudafed or Afrin x 3d  3. Vitamin D deficiency On supplement, recheck level next visit  4. Fatty liver disease, nonalcoholic Dx/px discussed, advised exercise/weight loss, repeat enzymes next visit  5.  Gallstones Dx/px discussed, asymptomatic at present  6. Obesity, Class II, BMI 35-39.9, with comorbidity (HCC) Exercise/weight loss discussed  7. Med review Consider d/c asa next visit (71yr CV risk 1.6%)  Return in about 3 months (around 12/03/2015).  Dionne Ano. Kingsley Spittle MD [facility]  09/04/2015

## 2015-09-04 NOTE — Patient Instructions (Signed)

## 2015-11-06 ENCOUNTER — Ambulatory Visit (INDEPENDENT_AMBULATORY_CARE_PROVIDER_SITE_OTHER): Payer: 59 | Admitting: Family Medicine

## 2015-11-06 ENCOUNTER — Encounter: Payer: Self-pay | Admitting: Family Medicine

## 2015-11-06 VITALS — BP 122/67 | HR 96 | Ht 70.5 in | Wt 264.0 lb

## 2015-11-06 DIAGNOSIS — E559 Vitamin D deficiency, unspecified: Secondary | ICD-10-CM | POA: Diagnosis not present

## 2015-11-06 DIAGNOSIS — K8020 Calculus of gallbladder without cholecystitis without obstruction: Secondary | ICD-10-CM | POA: Diagnosis not present

## 2015-11-06 DIAGNOSIS — IMO0001 Reserved for inherently not codable concepts without codable children: Secondary | ICD-10-CM

## 2015-11-06 DIAGNOSIS — K760 Fatty (change of) liver, not elsewhere classified: Secondary | ICD-10-CM

## 2015-11-06 DIAGNOSIS — R7303 Prediabetes: Secondary | ICD-10-CM | POA: Diagnosis not present

## 2015-11-06 NOTE — Progress Notes (Signed)
Date:  11/06/2015   Name:  Richard Frost   DOB:  03/20/1971   MRN:  [id number]  PCP:  Schuyler AmorWilliam Jadiel Schmieder, MD    Chief Complaint: Follow-up   History of Present Illness:  This is a 45 y.o. male for 2 month f/u. Recently changed to Remeron for OCD. Weight up 9#, on NCS diet but not exercising regularly. Taking vit D supp but no longer taking asa. No gallstone sxs.   Review of Systems:  Review of Systems  Constitutional: Negative for fever and fatigue.  Respiratory: Negative for cough and shortness of breath.   Cardiovascular: Negative for chest pain and leg swelling.  Endocrine: Negative for polyuria.  Genitourinary: Negative for difficulty urinating.  Neurological: Negative for syncope and light-headedness.    Patient Active Problem List   Diagnosis Date Noted  . Gallstones 08/19/2015  . Fatty liver disease, nonalcoholic 08/19/2015  . Vitamin D deficiency 08/11/2015  . OCD (obsessive compulsive disorder) 08/08/2015  . Allergic rhinitis 08/08/2015  . Nephrolithiasis 08/08/2015  . Prediabetes 08/08/2015  . OSA treated with BiPAP 08/08/2015  . Obesity, Class II, BMI 35-39.9, with comorbidity (HCC) 08/08/2015    Prior to Admission medications   Medication Sig Start Date End Date Taking? Authorizing Provider  aspirin 81 MG tablet Take 81 mg by mouth daily.   Yes Historical Provider, MD  cetirizine (ZYRTEC) 10 MG tablet Take 10 mg by mouth daily.   Yes Historical Provider, MD  Cholecalciferol (VITAMIN D) 2000 units CAPS Take 1 capsule (2,000 Units total) by mouth daily. Patient taking differently: Take 2 capsules by mouth every other day.  08/11/15  Yes Schuyler AmorWilliam Annielee Jemmott, MD  clonazePAM (KLONOPIN) 2 MG tablet Take 1 tablet by mouth at bedtime. 10/22/15  Yes Historical Provider, MD  fluvoxaMINE (LUVOX) 100 MG tablet Take 100 mg by mouth 2 (two) times daily. Dr Percell BeltMillet   Yes Historical Provider, MD  mirtazapine (REMERON) 15 MG tablet Take 1 tablet by mouth at bedtime. 10/22/15  Yes Historical  Provider, MD    Allergies  Allergen Reactions  . Septra [Sulfamethoxazole-Trimethoprim] Hives  . Sulfa Antibiotics     Past Surgical History  Procedure Laterality Date  . Lithotripsy    . Lithotripsy      x 7  . Internal urethrotomy      remove kidney stone    Social History  Substance Use Topics  . Smoking status: Never Smoker   . Smokeless tobacco: None  . Alcohol Use: No    Family History  Problem Relation Age of Onset  . Osteoarthritis Mother   . Hyperlipidemia Father   . Osteoarthritis Father   . Hypertension Father   . Hypertension Sister   . Cancer Maternal Aunt   . Diabetes Paternal Aunt   . Diabetes Paternal Grandmother     Medication list has been reviewed and updated.  Physical Examination: BP 122/67 mmHg  Pulse 96  Ht 5' 10.5" (1.791 m)  Wt 264 lb (119.75 kg)  BMI 37.33 kg/m2  Physical Exam  Constitutional: He appears well-developed and well-nourished.  Cardiovascular: Normal rate, regular rhythm and normal heart sounds.   Pulmonary/Chest: Effort normal and breath sounds normal.  Musculoskeletal: He exhibits no edema.  Neurological: He is alert.  Skin: Skin is warm and dry.  Psychiatric: He has a normal mood and affect. His behavior is normal.  Nursing note and vitals reviewed.   Assessment and Plan:  1. Obesity, Class II, BMI 35-39.9, with comorbidity (HCC) Weight up 9# (Remeron  may be contributing), discussed diet, exercise 30 mins/day  2. Fatty liver disease, nonalcoholic - Hepatic function panel  3. Prediabetes - HgB A1c  4. Vitamin D deficiency On supplement - Vitamin D (25 hydroxy)  5. Gallstones Asymptomatic, monitor  6. Med review Stay off asa  Return in about 3 months (around 02/06/2016).  Dionne Ano. Kingsley Spittle MD [facility]  11/06/2015

## 2015-11-07 ENCOUNTER — Other Ambulatory Visit: Payer: Self-pay

## 2015-11-07 LAB — HEPATIC FUNCTION PANEL
ALBUMIN: 4.2 g/dL (ref 3.5–5.5)
ALT: 22 IU/L (ref 0–44)
AST: 21 IU/L (ref 0–40)
Alkaline Phosphatase: 80 IU/L (ref 39–117)
BILIRUBIN TOTAL: 0.6 mg/dL (ref 0.0–1.2)
BILIRUBIN, DIRECT: 0.14 mg/dL (ref 0.00–0.40)
TOTAL PROTEIN: 6.8 g/dL (ref 6.0–8.5)

## 2015-11-07 LAB — HEMOGLOBIN A1C
ESTIMATED AVERAGE GLUCOSE: 120 mg/dL
Hgb A1c MFr Bld: 5.8 % — ABNORMAL HIGH (ref 4.8–5.6)

## 2015-11-07 LAB — VITAMIN D 25 HYDROXY (VIT D DEFICIENCY, FRACTURES): VIT D 25 HYDROXY: 27 ng/mL — AB (ref 30.0–100.0)

## 2015-11-17 ENCOUNTER — Ambulatory Visit (INDEPENDENT_AMBULATORY_CARE_PROVIDER_SITE_OTHER): Payer: 59 | Admitting: Family Medicine

## 2015-11-17 ENCOUNTER — Encounter: Payer: Self-pay | Admitting: Family Medicine

## 2015-11-17 VITALS — BP 120/62 | HR 76 | Ht 70.0 in | Wt 267.0 lb

## 2015-11-17 DIAGNOSIS — S46812A Strain of other muscles, fascia and tendons at shoulder and upper arm level, left arm, initial encounter: Secondary | ICD-10-CM

## 2015-11-17 DIAGNOSIS — F429 Obsessive-compulsive disorder, unspecified: Secondary | ICD-10-CM

## 2015-11-17 DIAGNOSIS — R7303 Prediabetes: Secondary | ICD-10-CM | POA: Diagnosis not present

## 2015-11-17 DIAGNOSIS — E559 Vitamin D deficiency, unspecified: Secondary | ICD-10-CM | POA: Diagnosis not present

## 2015-11-17 DIAGNOSIS — K760 Fatty (change of) liver, not elsewhere classified: Secondary | ICD-10-CM | POA: Diagnosis not present

## 2015-11-17 DIAGNOSIS — IMO0001 Reserved for inherently not codable concepts without codable children: Secondary | ICD-10-CM

## 2015-11-17 MED ORDER — CHOLECALCIFEROL 125 MCG (5000 UT) PO CAPS: 5000.0000 [IU] | ORAL_CAPSULE | Freq: Every day | ORAL | Status: DC

## 2015-11-17 NOTE — Progress Notes (Signed)
Date:  11/17/2015   Name:  Richard Frost   DOB:  Feb 28, 1971   MRN:  [id number]  PCP:  [name], MD    Chief Complaint: Neck Pain   History of Present Illness:  This is a 45 y.o. male  who woke yesterday morning with intermittent L neck pain, getting better, has not had for most of day today. Had used new pillow the night before. Weight still increasing (up 3# past two weeks) on Remeron. LFT's normal last visit but prediabetes worse. Vit D level low, taking increased supplement dose.   Review of Systems:  Review of Systems  Constitutional: Negative for fever and fatigue.  Respiratory: Negative for cough and choking.   Cardiovascular: Negative for chest pain and leg swelling.  Endocrine: Negative for polyuria.  Genitourinary: Negative for difficulty urinating.  Neurological: Negative for syncope and light-headedness.    Patient Active Problem List   Diagnosis Date Noted  . Gallstones 08/19/2015  . Fatty liver disease, nonalcoholic 08/19/2015  . Vitamin D deficiency 08/11/2015  . OCD (obsessive compulsive disorder) 08/08/2015  . Allergic rhinitis 08/08/2015  . Nephrolithiasis 08/08/2015  . Prediabetes 08/08/2015  . OSA treated with BiPAP 08/08/2015  . Obesity, Class II, BMI 35-39.9, with comorbidity (HCC) 08/08/2015    Prior to Admission medications   Medication Sig Start Date End Date Taking? Authorizing Provider  cetirizine (ZYRTEC) 10 MG tablet Take 10 mg by mouth daily.   Yes Historical Provider, MD  Cholecalciferol (VITAMIN D) 2000 units CAPS Take 1 capsule (2,000 Units total) by mouth daily. Patient taking differently: Take by mouth. Pt taking 5,000 units qday 08/11/15  Yes [name], MD  clonazePAM (KLONOPIN) 2 MG tablet Take 1 tablet by mouth at bedtime. 10/22/15  Yes Historical Provider, MD  fluvoxaMINE (LUVOX) 100 MG tablet Take 100 mg by mouth 2 (two) times daily. Dr Percell Belt   Yes Historical Provider, MD  mirtazapine (REMERON) 15 MG tablet Take 1 tablet by  mouth at bedtime. 10/22/15  Yes Historical Provider, MD    Allergies  Allergen Reactions  . Septra [Sulfamethoxazole-Trimethoprim] Hives  . Sulfa Antibiotics     Past Surgical History  Procedure Laterality Date  . Lithotripsy    . Lithotripsy      x 7  . Internal urethrotomy      remove kidney stone    Social History  Substance Use Topics  . Smoking status: Never Smoker   . Smokeless tobacco: None  . Alcohol Use: No    Family History  Problem Relation Age of Onset  . Osteoarthritis Mother   . Hyperlipidemia Father   . Osteoarthritis Father   . Hypertension Father   . Hypertension Sister   . Cancer Maternal Aunt   . Diabetes Paternal Aunt   . Diabetes Paternal Grandmother     Medication list has been reviewed and updated.  Physical Examination: BP 120/62 mmHg  Pulse 76  Ht  (1.778 m)  Wt 267 lb (121.11 kg)  BMI 38.31 kg/m2  Physical Exam  Constitutional: He appears well-developed and well-nourished.  Cardiovascular: Normal rate, regular rhythm and normal heart sounds.   Pulmonary/Chest: Effort normal and breath sounds normal.  Musculoskeletal: He exhibits no edema.  Slightly tender along L upper trapezius  Neurological: He is alert.  Skin: Skin is warm and dry.  Psychiatric: He has a normal mood and affect. His behavior is normal.  Nursing note and vitals reviewed.   Assessment and Plan:  1. Trapezius strain, left, initial  encounter Improving spontaneously, avoid offending pillow, call if sxs persist/recur  2. OCD (obsessive compulsive disorder) Discuss possible d/c Remeron given progressive weight gain  3. Fatty liver disease, nonalcoholic Improved   4. Prediabetes Slightly worse, discussed exercise/weight loss  5. Obesity, Class II, BMI 35-39.9, with comorbidity (HCC)  6. Vitamin D deficiency On increased supplement  Return if symptoms worsen or fail to improve.  Dionne AnoWilliam M. Kingsley SpittlePlonk, Jr. MD [facility]  11/17/2015

## 2016-02-06 ENCOUNTER — Ambulatory Visit: Payer: Self-pay | Admitting: Family Medicine

## 2016-02-12 ENCOUNTER — Ambulatory Visit (INDEPENDENT_AMBULATORY_CARE_PROVIDER_SITE_OTHER): Payer: 59 | Admitting: Family Medicine

## 2016-02-12 ENCOUNTER — Encounter: Payer: Self-pay | Admitting: Family Medicine

## 2016-02-12 VITALS — BP 112/80 | HR 64 | Ht 70.0 in | Wt 257.0 lb

## 2016-02-12 DIAGNOSIS — E559 Vitamin D deficiency, unspecified: Secondary | ICD-10-CM

## 2016-02-12 DIAGNOSIS — R7303 Prediabetes: Secondary | ICD-10-CM

## 2016-02-12 DIAGNOSIS — M7542 Impingement syndrome of left shoulder: Secondary | ICD-10-CM

## 2016-02-12 MED ORDER — CHOLECALCIFEROL 125 MCG (5000 UT) PO CAPS: 5000.0000 [IU] | ORAL_CAPSULE | Freq: Every day | ORAL | Status: DC

## 2016-02-12 NOTE — Patient Instructions (Signed)

## 2016-02-12 NOTE — Progress Notes (Signed)
Name: Richard Frost   MRN: [id number]    DOB: 12/31/1970   Date:02/12/2016       Progress Note  Subjective  Chief Complaint  Chief Complaint  Patient presents with  . Follow-up    recheck A1C and Vit D    HPI Comments: Patient needs refills for vitamin D deficiency.    Shoulder Pain  The pain is present in the left shoulder. This is a new problem. The current episode started 1 to 4 weeks ago. The problem occurs daily. The problem has been waxing and waning. The quality of the pain is described as aching. The pain is at a severity of 2/10. The pain is mild. Pertinent negatives include no fever, inability to bear weight, itching, joint locking, joint swelling, limited range of motion, numbness, stiffness or tingling. The symptoms are aggravated by activity. He has tried NSAIDS and acetaminophen for the symptoms. The treatment provided mild relief.    No problem-specific assessment & plan notes found for this encounter.   Past Medical History  Diagnosis Date  . Sleep apnea   . Kidney stones   . Allergy   . OCD (obsessive compulsive disorder)   . Depression   . Anxiety     Past Surgical History  Procedure Laterality Date  . Lithotripsy    . Lithotripsy      x 7  . Internal urethrotomy      remove kidney stone    Family History  Problem Relation Age of Onset  . Osteoarthritis Mother   . Hyperlipidemia Father   . Osteoarthritis Father   . Hypertension Father   . Hypertension Sister   . Cancer Maternal Aunt   . Diabetes Paternal Aunt   . Diabetes Paternal Grandmother     Social History   Social History  . Marital Status: Married    Spouse Name: N/A  . Number of Children: N/A  . Years of Education: N/A   Occupational History  . Not on file.   Social History Main Topics  . Smoking status: Never Smoker   . Smokeless tobacco: Not on file  . Alcohol Use: No  . Drug Use: No  . Sexual Activity: Not Currently   Other Topics Concern  . Not on file   Social  History Narrative   ** Merged History Encounter **        Allergies  Allergen Reactions  . Septra [Sulfamethoxazole-Trimethoprim] Hives  . Sulfa Antibiotics      Review of Systems  Constitutional: Negative for fever, chills, weight loss and malaise/fatigue.  HENT: Negative for ear discharge, ear pain and sore throat.   Eyes: Negative for blurred vision.  Respiratory: Negative for cough, sputum production, shortness of breath and wheezing.   Cardiovascular: Negative for chest pain, palpitations and leg swelling.  Gastrointestinal: Negative for heartburn, nausea, abdominal pain, diarrhea, constipation, blood in stool and melena.  Genitourinary: Negative for dysuria, urgency, frequency and hematuria.  Musculoskeletal: Positive for joint pain. Negative for myalgias, back pain, stiffness and neck pain.       Left shoulder pain  Skin: Negative for itching and rash.  Neurological: Negative for dizziness, tingling, sensory change, focal weakness, numbness and headaches.  Endo/Heme/Allergies: Negative for environmental allergies and polydipsia. Does not bruise/bleed easily.  Psychiatric/Behavioral: Negative for depression and suicidal ideas. The patient is not nervous/anxious and does not have insomnia.      Objective  Filed Vitals:   02/12/16 1004  BP: 112/80  Pulse: 64  Height:  5\' 10"  (1.778 m)  Weight: 257 lb (116.574 kg)    Physical Exam  Constitutional: He is oriented to person, place, and time and well-developed, well-nourished, and in no distress.  HENT:  Head: Normocephalic.  Right Ear: External ear normal.  Left Ear: External ear normal.  Nose: Nose normal.  Mouth/Throat: Oropharynx is clear and moist.  Eyes: Conjunctivae and EOM are normal. Pupils are equal, round, and reactive to light. Right eye exhibits no discharge. Left eye exhibits no discharge. No scleral icterus.  Neck: Normal range of motion. Neck supple. No JVD present. No tracheal deviation present. No  thyromegaly present.  Cardiovascular: Normal rate, regular rhythm, normal heart sounds and intact distal pulses.  Exam reveals no gallop and no friction rub.   No murmur heard. Pulmonary/Chest: Breath sounds normal. No respiratory distress. He has no wheezes. He has no rales.  Abdominal: Soft. Bowel sounds are normal. He exhibits no mass. There is no hepatosplenomegaly. There is no tenderness. There is no rebound, no guarding and no CVA tenderness.  Musculoskeletal: Normal range of motion. He exhibits no edema or tenderness.  Lymphadenopathy:    He has no cervical adenopathy.  Neurological: He is alert and oriented to person, place, and time. He has normal sensation, normal strength, normal reflexes and intact cranial nerves. No cranial nerve deficit.  Skin: Skin is warm. No rash noted.  Psychiatric: Mood and affect normal.  Nursing note and vitals reviewed.     Assessment & Plan  Problem List Items Addressed This Visit      Other   Prediabetes - Primary   Relevant Orders   Hemoglobin A1c   Basic metabolic panel   Vitamin D deficiency    Other Visit Diagnoses    Impingement syndrome of left shoulder        Relevant Orders    Ambulatory referral to Orthopedic Surgery         Dr. Elizabeth Sauereanna Jones [facility]  02/12/2016

## 2016-02-13 LAB — BASIC METABOLIC PANEL
BUN/Creatinine Ratio: 14 (ref 9–20)
BUN: 15 mg/dL (ref 6–24)
CALCIUM: 9.7 mg/dL (ref 8.7–10.2)
CHLORIDE: 101 mmol/L (ref 96–106)
CO2: 23 mmol/L (ref 18–29)
Creatinine, Ser: 1.11 mg/dL (ref 0.76–1.27)
GFR calc Af Amer: 92 mL/min/{1.73_m2} (ref >59)
GFR calc non Af Amer: 79 mL/min/{1.73_m2} (ref >59)
GLUCOSE: 93 mg/dL (ref 65–99)
Potassium: 4.6 mmol/L (ref 3.5–5.2)
SODIUM: 142 mmol/L (ref 134–144)

## 2016-02-13 LAB — HEMOGLOBIN A1C
ESTIMATED AVERAGE GLUCOSE: 120 mg/dL
HEMOGLOBIN A1C: 5.8 % — AB (ref 4.8–5.6)

## 2016-02-23 ENCOUNTER — Other Ambulatory Visit: Payer: Self-pay | Admitting: Orthopedic Surgery

## 2016-02-23 DIAGNOSIS — M25312 Other instability, left shoulder: Secondary | ICD-10-CM

## 2016-02-23 DIAGNOSIS — S46912A Strain of unspecified muscle, fascia and tendon at shoulder and upper arm level, left arm, initial encounter: Secondary | ICD-10-CM

## 2016-02-23 DIAGNOSIS — M25512 Pain in left shoulder: Secondary | ICD-10-CM

## 2016-02-23 DIAGNOSIS — G8929 Other chronic pain: Secondary | ICD-10-CM

## 2016-03-05 ENCOUNTER — Ambulatory Visit
Admission: RE | Admit: 2016-03-05 | Discharge: 2016-03-05 | Disposition: A | Discharge status: Home / Self Care | Payer: 59 | Source: Ambulatory Visit | Attending: Orthopedic Surgery | Admitting: Orthopedic Surgery

## 2016-03-05 DIAGNOSIS — X58XXXA Exposure to other specified factors, initial encounter: Secondary | ICD-10-CM | POA: Insufficient documentation

## 2016-03-05 DIAGNOSIS — M25512 Pain in left shoulder: Secondary | ICD-10-CM | POA: Diagnosis present

## 2016-03-05 DIAGNOSIS — S46912A Strain of unspecified muscle, fascia and tendon at shoulder and upper arm level, left arm, initial encounter: Secondary | ICD-10-CM | POA: Insufficient documentation

## 2016-03-05 DIAGNOSIS — M75112 Incomplete rotator cuff tear or rupture of left shoulder, not specified as traumatic: Secondary | ICD-10-CM | POA: Diagnosis not present

## 2016-03-05 DIAGNOSIS — M25312 Other instability, left shoulder: Secondary | ICD-10-CM | POA: Diagnosis not present

## 2016-03-05 DIAGNOSIS — M19212 Secondary osteoarthritis, left shoulder: Secondary | ICD-10-CM | POA: Insufficient documentation

## 2016-03-05 DIAGNOSIS — G8929 Other chronic pain: Secondary | ICD-10-CM

## 2016-08-16 ENCOUNTER — Ambulatory Visit (INDEPENDENT_AMBULATORY_CARE_PROVIDER_SITE_OTHER): Payer: 59 | Admitting: Family Medicine

## 2016-08-16 ENCOUNTER — Ambulatory Visit: Payer: Self-pay | Admitting: Family Medicine

## 2016-08-16 ENCOUNTER — Encounter: Payer: Self-pay | Admitting: Family Medicine

## 2016-08-16 VITALS — BP 110/62 | HR 92 | Ht 70.0 in | Wt 271.0 lb

## 2016-08-16 DIAGNOSIS — R7303 Prediabetes: Secondary | ICD-10-CM | POA: Diagnosis not present

## 2016-08-16 MED ORDER — METFORMIN HCL 500 MG PO TABS: 500.0000 mg | ORAL_TABLET | Freq: Every day | ORAL | 6 refills | Status: DC

## 2016-08-16 NOTE — Progress Notes (Signed)
Name: Richard Frost   MRN: [id number]    DOB: 01/13/1971   Date:08/16/2016       Progress Note  Subjective  Chief Complaint  Chief Complaint  Patient presents with  . Follow-up    borderline diabetic- last A1C was 5.8 in June- told we would recheck this month but no meds for now    Diabetes  He presents for his follow-up diabetic visit. He has type 2 diabetes mellitus. His disease course has been improving. There are no hypoglycemic associated symptoms. Pertinent negatives for hypoglycemia include no dizziness, headaches or nervousness/anxiousness. Associated symptoms include polydipsia. Pertinent negatives for diabetes include no blurred vision, no chest pain, no fatigue, no foot paresthesias, no foot ulcerations, no polyphagia, no polyuria, no visual change, no weakness and no weight loss. There are no hypoglycemic complications. Symptoms are stable. Current diabetic treatment includes diet. He is following a generally healthy diet. He participates in exercise weekly. An ACE inhibitor/angiotensin II receptor blocker is not being taken. He does not see a podiatrist.Eye exam is not current.    No problem-specific Assessment & Plan notes found for this encounter.   Past Medical History:  Diagnosis Date  . Allergy   . Anxiety   . Depression   . Kidney stones   . OCD (obsessive compulsive disorder)   . Sleep apnea     Past Surgical History:  Procedure Laterality Date  . INTERNAL URETHROTOMY     remove kidney stone  . LITHOTRIPSY    . LITHOTRIPSY     x 7    Family History  Problem Relation Age of Onset  . Osteoarthritis Mother   . Hyperlipidemia Father   . Osteoarthritis Father   . Hypertension Father   . Hypertension Sister   . Cancer Maternal Aunt   . Diabetes Paternal Aunt   . Diabetes Paternal Grandmother     Social History   Social History  . Marital status: Married    Spouse name: N/A  . Number of children: N/A  . Years of education: N/A   Occupational  History  . Not on file.   Social History Main Topics  . Smoking status: Never Smoker  . Smokeless tobacco: Never Used  . Alcohol use No  . Drug use: No  . Sexual activity: Not Currently   Other Topics Concern  . Not on file   Social History Narrative   ** Merged History Encounter **        Allergies  Allergen Reactions  . Septra [Sulfamethoxazole-Trimethoprim] Hives  . Sulfa Antibiotics      Review of Systems  Constitutional: Negative for chills, fatigue, fever, malaise/fatigue and weight loss.  HENT: Negative for ear discharge, ear pain and sore throat.   Eyes: Negative for blurred vision.  Respiratory: Negative for cough, sputum production, shortness of breath and wheezing.   Cardiovascular: Negative for chest pain, palpitations and leg swelling.  Gastrointestinal: Negative for abdominal pain, blood in stool, constipation, diarrhea, heartburn, melena and nausea.  Genitourinary: Negative for dysuria, frequency, hematuria and urgency.  Musculoskeletal: Negative for back pain, joint pain, myalgias and neck pain.  Skin: Negative for rash.  Neurological: Negative for dizziness, tingling, sensory change, focal weakness, weakness and headaches.  Endo/Heme/Allergies: Positive for polydipsia. Negative for environmental allergies and polyphagia. Does not bruise/bleed easily.  Psychiatric/Behavioral: Negative for depression and suicidal ideas. The patient is not nervous/anxious and does not have insomnia.      Objective  Vitals:   08/16/16 1337  BP:  110/62  Pulse: 92  SpO2: 99%  Weight: 271 lb (122.9 kg)  Height: 5\' 10"  (1.778 m)    Physical Exam  Constitutional: He is oriented to person, place, and time and well-developed, well-nourished, and in no distress.  HENT:  Head: Normocephalic.  Right Ear: External ear normal.  Left Ear: External ear normal.  Nose: Nose normal.  Mouth/Throat: Oropharynx is clear and moist.  Eyes: Conjunctivae and EOM are normal. Pupils are  equal, round, and reactive to light. Right eye exhibits no discharge. Left eye exhibits no discharge. No scleral icterus.  Neck: Normal range of motion. Neck supple. No JVD present. No tracheal deviation present. No thyromegaly present.  Cardiovascular: Normal rate, regular rhythm, normal heart sounds and intact distal pulses.  Exam reveals no gallop and no friction rub.   No murmur heard. Pulmonary/Chest: Breath sounds normal. No respiratory distress. He has no wheezes. He has no rales.  Abdominal: Soft. Bowel sounds are normal. He exhibits no mass. There is no hepatosplenomegaly. There is no tenderness. There is no rebound, no guarding and no CVA tenderness.  Musculoskeletal: Normal range of motion. He exhibits no edema or tenderness.  Lymphadenopathy:    He has no cervical adenopathy.  Neurological: He is alert and oriented to person, place, and time. He has normal sensation, normal strength, normal reflexes and intact cranial nerves. No cranial nerve deficit.  Skin: Skin is warm. No rash noted.  Psychiatric: Mood and affect normal.  Nursing note and vitals reviewed.     Assessment & Plan  Problem List Items Addressed This Visit      Other   Prediabetes - Primary   Relevant Medications   metFORMIN (GLUCOPHAGE) 500 MG tablet   Other Relevant Orders   Hemoglobin A1c        Dr. Hayden [facility]  08/16/16

## 2016-08-16 NOTE — Patient Instructions (Addendum)
Metabolic Syndrome Introduction Metabolic syndrome is the presence of at least three factors that increase your risk of getting cardiovascular disease and diabetes. These factors are:  High blood sugar.  High blood triglyceride level.  High blood pressure.  Low levels of good blood cholesterol (high-density lipoprotein or HDL).  Excess weight around the waist. This factor is present with a waist measurement of:  More than 40 inches in men.  More than 35 inches in women. Metabolic syndrome is sometimes called insulin resistance syndrome and syndrome X. What are the causes? The exact cause is not known, but genetics and lifestyle choices play a role. What increases the risk? You are more likely to develop metabolic syndrome if:  You eat a diet high in calories and saturated fat.  You do not exercise regularly.  You are overweight.  You have a family history of metabolic syndrome.  You are Asian.  You are older in age.  You have insulin resistance.  You use any tobacco products, including cigarettes, chewing tobacco, or electronic cigarettes. What are the signs or symptoms? Metabolic syndrome has no specific symptoms. How is this diagnosed? To make a diagnosis, your health care provider will determine whether you have at least three of the factors that make up metabolic syndrome by:  Taking your blood pressure.  Measuring your waist.  Ordering blood tests. How is this treated? Treatment may include:  Lifestyle changes to reduce your risk for heart disease and stroke, such as:  Exercise.  Weight loss.  Maintaining a healthy diet.  Quitting the use of any tobacco products, including cigarettes, chewing tobacco, or electronic cigarettes.  Medicines that:  Help your body to maintain glucose control.  Reduce your blood pressure and your blood triglyceride levels. Follow these instructions at home:  Exercise regularly.  Maintain a healthy diet.  Do not  use any tobacco products, including cigarettes, chewing tobacco, or electronic cigarettes. If you need help quitting, ask your health care provider.  Keep all follow-up visits as directed by your health care provider. This is important.  Measure your waist regularly and record the measurement. To measure your waist:  Stand up straight.  Breathe out.  Wrap the measuring tape around the part of your waist that is just above your hipbones.  Read the measurement. Contact a health care provider if:  You feel very tired.  You develop excessive thirst.  You pass large quantities of urine.  You put on weight around your waist.  You have headaches over and over again.  You have a dizzy spell. Get help right away if:  You develop sudden blurred vision.  You develop a sudden dizzy spell.  You have sudden trouble speaking or swallowing.  You have sudden weakness in your arm or leg.  You have chest pains or trouble breathing.  You feel like your heartbeat is abnormal.  You faint. This information is not intended to replace advice given to you by your health care provider. Make sure you discuss any questions you have with your health care provider. Document Released: 11/02/2007 Document Revised: 01/01/2016 Document Reviewed: 03/01/2014  2017 Elsevier Diet for Metabolic Syndrome Metabolic syndrome is a disorder that includes at least three of these conditions:  Abdominal obesity.  Too much sugar in your blood.  High blood pressure.  Higher than normal amount of fat (lipids) in your blood.  Lower than normal level of "good" cholesterol (HDL). Following a healthy diet can help to keep metabolic syndrome under control. It can  also help to prevent the development of conditions that are associated with metabolic syndrome, such as diabetes, heart disease, and stroke. Along with exercise, a healthy diet:  Helps to improve the way that the body uses insulin.  Promotes weight  loss. A common goal for people with this condition is to lose at least 7 to 10 percent of their starting weight. What do I need to know about this diet?  Use the glycemic index (GI) to plan your meals. The index tells you how quickly a food will raise your blood sugar. Choose foods that have low GI values. These foods take a longer time to raise blood sugar.  Keep track of how many calories you take in. Eating the right amount of calories will help your achieve a healthy weight.  You may want to follow a Mediterranean diet. This diet includes lots of vegetables, lean meats or fish, whole grains, fruits, and healthy oils and fats. What foods can I eat? Grains  Stone-ground whole wheat. Pumpernickel bread. Whole-grain bread, crackers, tortillas, cereal, and pasta. Unsweetened oatmeal.Bulgur.Barley.Quinoa.Brown rice or wild rice. Vegetables  Lettuce. Spinach. Peas. Beets. Cauliflower. Cabbage. Broccoli. Carrots. Tomatoes. Squash. Eggplant. Herbs. Peppers. Onions. Cucumbers. Brussels sprouts. Sweet potatoes. Yams. Beans. Lentils. Fruits  Berries. Apples. Oranges. Grapes. Mango. Pomegranate. Kiwi. Cherries. Meats and Other Protein Sources  Seafood and shellfish. Lean meats.Poultry. Tofu. Dairy  Low-fat or fat-free dairy products, such as milk, yogurt, and cheese. Beverages  Water. Low-fat milk. Milk alternatives, like soy milk or almond milk. Real fruit juice. Condiments  Low-sugar or sugar-free ketchup, barbecue sauce, and mayonnaise. Mustard. Relish. Fats and Oils  Avocado. Canola or olive oil. Nuts and nut butters.Seeds. The items listed above may not be a complete list of recommended foods or beverages. Contact your dietitian for more options.  What foods are not recommended? Red meat. Palm oil and coconut oil. Processed foods. Fried foods. Alcohol. Sweetened drinks, such as iced tea and soda. Sweets. Salty foods. The items listed above may not be a complete list of foods and  beverages to avoid. Contact your dietitian for more information.  This information is not intended to replace advice given to you by your health care provider. Make sure you discuss any questions you have with your health care provider. Document Released: 12/10/2014 Document Revised: 12/05/2015 Document Reviewed: 08/07/2014 Elsevier Interactive Patient Education  2017 [employer]

## 2016-08-17 LAB — HEMOGLOBIN A1C
Est. average glucose Bld gHb Est-mCnc: 111 mg/dL
Hgb A1c MFr Bld: 5.5 % (ref 4.8–5.6)

## 2016-10-08 IMAGING — MR MR SHOULDER*L* W/O CM
5 series · 40 of 40 positions shown · non-contrast
Comparison: None.

CLINICAL DATA: Left shoulder strain, status post fall, decreased
range of motion

EXAM:
MRI OF THE LEFT SHOULDER WITHOUT CONTRAST
TECHNIQUE: Multiplanar, multisequence MR imaging of the shoulder was performed.
No intravenous contrast was administered.

[Series 4: T2 fat-sat · oblique · 4.0mm · 0.62mm/px · 7 of 19 slices shown (1 of 3)]
[im 1/19]
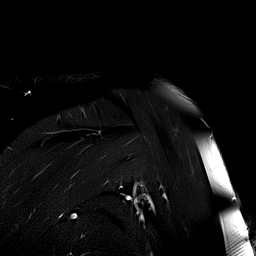
[im 4/19]
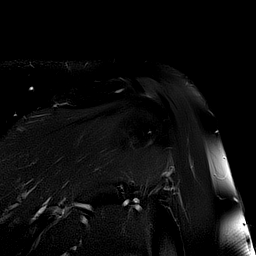
[im 7/19]
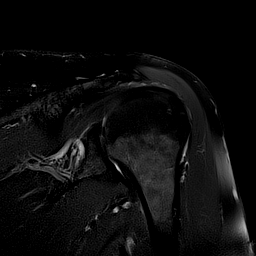
[im 10/19]
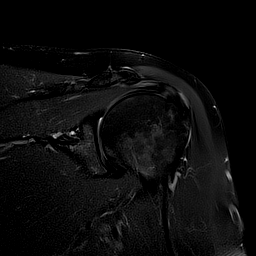
[im 13/19]
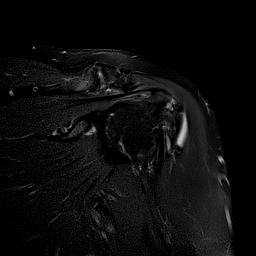
[im 16/19]
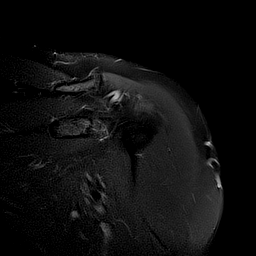
[im 19/19]
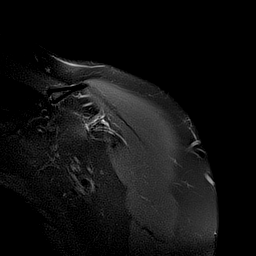

[Series 5: PD · oblique · 4.0mm · 0.62mm/px · 8 of 19 slices shown]
[im 1/19]
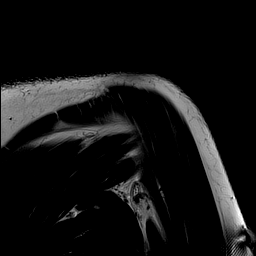
[im 3/19]
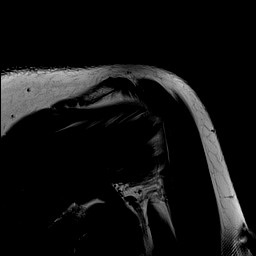
[im 6/19]
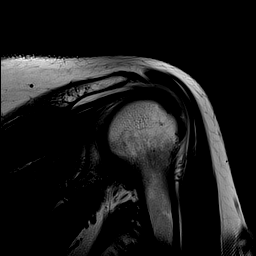
[im 8/19]
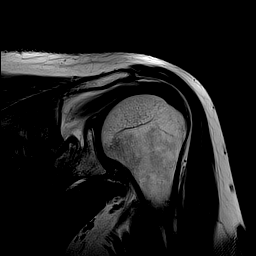
[im 11/19]
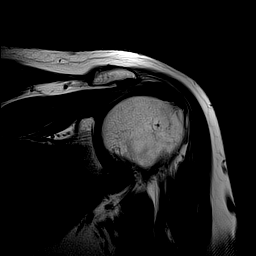
[im 13/19]
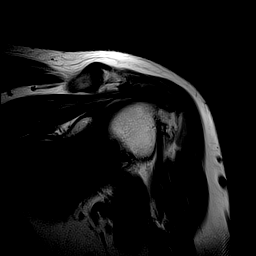
[im 16/19]
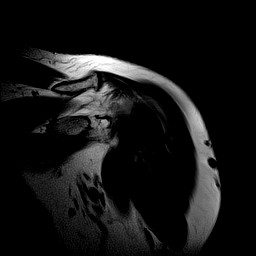
[im 19/19]
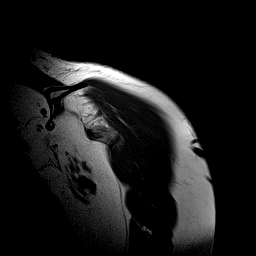

[Series 6: T1 · sagittal · 4.0mm · 0.55mm/px · 8 of 20 slices shown]
[im 1/20]
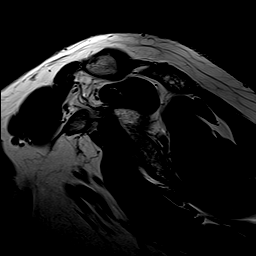
[im 3/20]
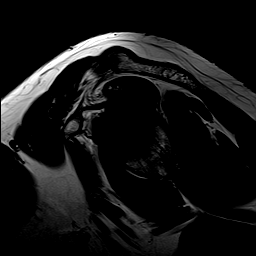
[im 6/20]
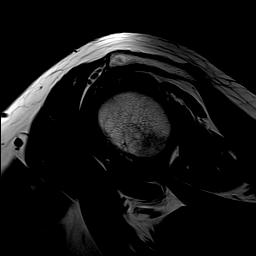
[im 9/20]
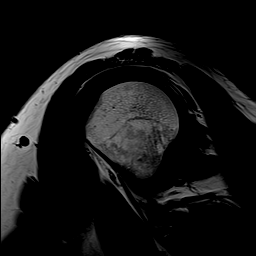
[im 11/20]
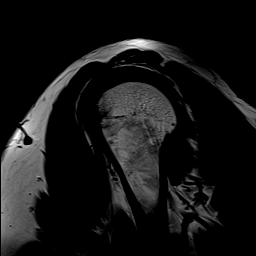
[im 14/20]
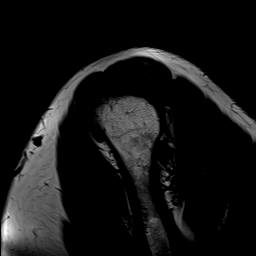
[im 17/20]
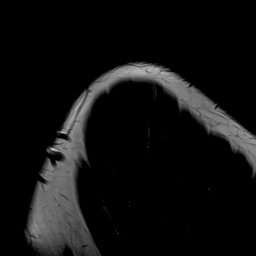
[im 20/20]
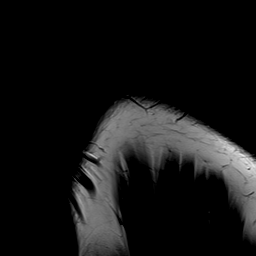

[Series 7: T2 fat-sat · sagittal · 4.0mm · 0.55mm/px · 8 of 20 slices shown (2 of 3)]
[im 1/20]
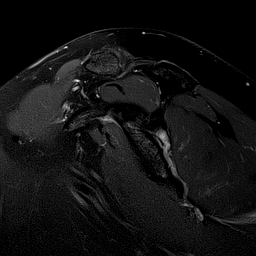
[im 3/20]
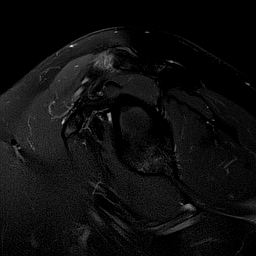
[im 6/20]
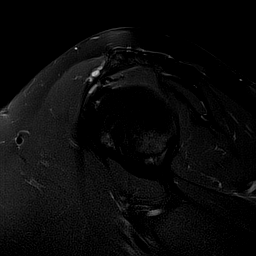
[im 9/20]
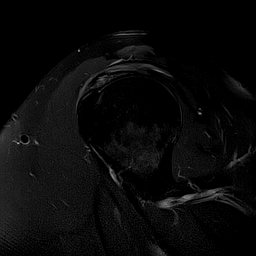
[im 11/20]
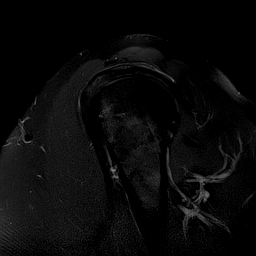
[im 14/20]
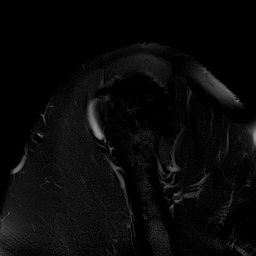
[im 17/20]
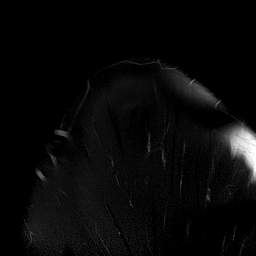
[im 20/20]
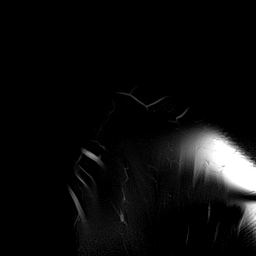

[Series 8: T2 fat-sat · axial · 4.0mm · 0.47mm/px · z∈[-31,+61]mm · 9 of 22 slices shown (3 of 3)]
[im 1/22]
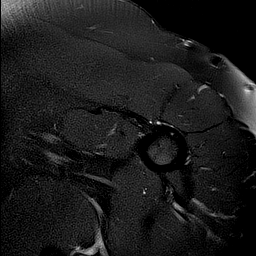
[im 3/22]
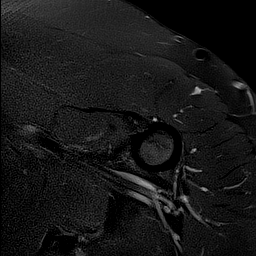
[im 6/22]
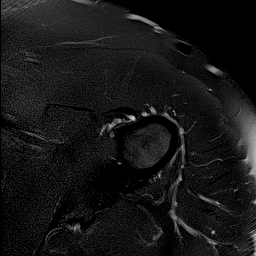
[im 8/22]
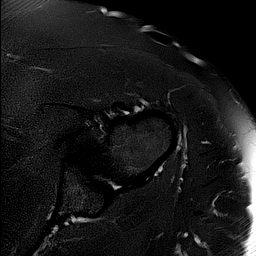
[im 11/22]
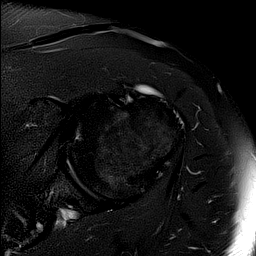
[im 14/22]
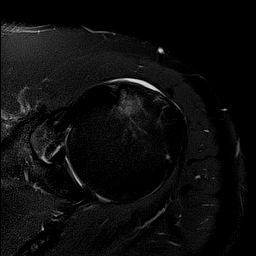
[im 16/22]
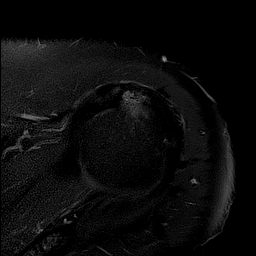
[im 19/22]
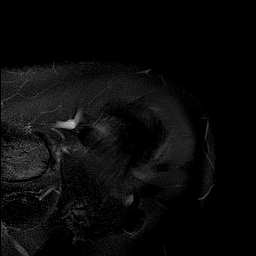
[im 22/22]
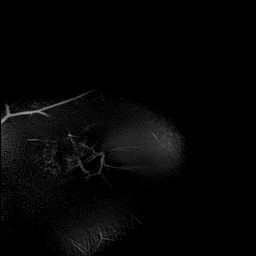

[40 of 40 positions shown; findings below may reference images not displayed]

FINDINGS: Rotator cuff: Severe tendinosis of the supraspinatus tendon with a
small partial thickness bursal surface tear anteriorly.
Infraspinatus tendon is intact. Teres minor tendon is intact.
Subscapularis tendon is intact.

Muscles: No atrophy or fatty replacement of nor abnormal signal
within, the muscles of the rotator cuff.

Biceps long head:  Intact.

Acromioclavicular Joint: Mild degenerative changes of the
acromioclavicular joint. Type I acromion. No subacromial/subdeltoid
bursal fluid.

Glenohumeral Joint: No significant joint effusion. No chondral
defect. No dislocation.

Labrum: Grossly intact, but evaluation is limited by lack of
intraarticular fluid.

Bones: No aggressive osseous lesion. Mild subcortical reactive
marrow edema in the anterior lateral humeral head adjacent to the
supraspinatus insertion versus osseous contusion secondary to fall.
No acute fracture.

Other: None
IMPRESSION: 1. Severe tendinosis of the supraspinatus tendon with a small
partial thickness bursal surface tear anteriorly.
2. Mild degenerative changes of acromioclavicular joint.
3. Mild subcortical reactive marrow edema in the anterior lateral
humeral head adjacent to the supraspinatus insertion versus osseous
contusion secondary to fall.

## 2016-12-21 ENCOUNTER — Other Ambulatory Visit: Payer: Self-pay

## 2016-12-21 ENCOUNTER — Telehealth: Payer: Self-pay

## 2016-12-21 NOTE — Telephone Encounter (Signed)
Pt called with increasing thirst and urination- started Metformin 500mg  today and called in testing strips, lancets, glucometer for him to test qday BS and see us in 6 weeks for follow up.

## 2016-12-24 ENCOUNTER — Other Ambulatory Visit: Payer: Self-pay

## 2017-01-01 ENCOUNTER — Encounter: Payer: Self-pay | Admitting: Emergency Medicine

## 2017-01-01 ENCOUNTER — Ambulatory Visit
Admission: EM | Admit: 2017-01-01 | Discharge: 2017-01-01 | Disposition: A | Discharge status: Home / Self Care | Payer: 59 | Attending: Family Medicine | Admitting: Family Medicine

## 2017-01-01 DIAGNOSIS — G4710 Hypersomnia, unspecified: Secondary | ICD-10-CM | POA: Diagnosis not present

## 2017-01-01 DIAGNOSIS — T887XXA Unspecified adverse effect of drug or medicament, initial encounter: Secondary | ICD-10-CM

## 2017-01-01 DIAGNOSIS — Z789 Other specified health status: Secondary | ICD-10-CM

## 2017-01-01 LAB — CBC WITH DIFFERENTIAL/PLATELET
Basophils Absolute: 0 10*3/uL (ref 0–0.1)
Basophils Relative: 0 %
Eosinophils Absolute: 0.1 10*3/uL (ref 0–0.7)
Eosinophils Relative: 2 %
HEMATOCRIT: 44.8 % (ref 40.0–52.0)
Hemoglobin: 14.9 g/dL (ref 13.0–18.0)
LYMPHS PCT: 34 %
Lymphs Abs: 2.1 10*3/uL (ref 1.0–3.6)
MCH: 30.1 pg (ref 26.0–34.0)
MCHC: 33.3 g/dL (ref 32.0–36.0)
MCV: 90.3 fL (ref 80.0–100.0)
MONO ABS: 0.5 10*3/uL (ref 0.2–1.0)
MONOS PCT: 8 %
NEUTROS ABS: 3.5 10*3/uL (ref 1.4–6.5)
Neutrophils Relative %: 56 %
Platelets: 208 10*3/uL (ref 150–440)
RBC: 4.96 MIL/uL (ref 4.40–5.90)
RDW: 13.9 % (ref 11.5–14.5)
WBC: 6.1 10*3/uL (ref 3.8–10.6)

## 2017-01-01 LAB — COMPREHENSIVE METABOLIC PANEL
ALT: 33 U/L (ref 17–63)
AST: 27 U/L (ref 15–41)
Albumin: 4.4 g/dL (ref 3.5–5.0)
Alkaline Phosphatase: 78 U/L (ref 38–126)
Anion gap: 5 (ref 5–15)
BILIRUBIN TOTAL: 0.8 mg/dL (ref 0.3–1.2)
BUN: 15 mg/dL (ref 6–20)
CO2: 28 mmol/L (ref 22–32)
Calcium: 9.2 mg/dL (ref 8.9–10.3)
Chloride: 102 mmol/L (ref 101–111)
Creatinine, Ser: 1.07 mg/dL (ref 0.61–1.24)
GFR calc Af Amer: >60 mL/min (ref >60)
Glucose, Bld: 108 mg/dL — ABNORMAL HIGH (ref 65–99)
POTASSIUM: 3.7 mmol/L (ref 3.5–5.1)
Sodium: 135 mmol/L (ref 135–145)
TOTAL PROTEIN: 8 g/dL (ref 6.5–8.1)

## 2017-01-01 NOTE — Discharge Instructions (Signed)
Stop the metformin. Resume home vitamin D as prescribed by your doctor. Rest. Drink plenty of fluids. Monitor for any other triggers.  Follow up with your primary care physician this week. Return to Urgent care for new or worsening concerns.

## 2017-01-01 NOTE — ED Triage Notes (Signed)
Metformin making him sleepy

## 2017-01-01 NOTE — ED Provider Notes (Signed)
[facility] ____________________________________________  Time seen: Approximately 1:57 PM  I have reviewed the triage vital signs and the nursing notes.   HISTORY  Chief Complaint Medication Reaction  HPI Richard Frost is a 46 y.o. male  presenting for evaluation of what he describes as sleepiness. Patient states 10 days ago on 12/22/2016 he started taking metformin, 500 mg once daily. Patient states that since starting the metformin he overall feels more sleepy than normal. Patient denies any other trigger or contributions to this. Patient states that he has also had a cold that is resolving during this timeframe that is not feeling that is correlated. Reports that he does have sleep apnea and uses CPAP. Patient states that he is sleeping well. Patient denies any changes in stress or diet. Patient reports other than feeling sleepy he feels fine. Reports continues to eat and drink well. Patient does report that he also takes prescription antidepressant, Klonopin and Abilify but states he does not fully these medications are relating to his feeling sleepy. Patient states that he's been taking the same medications to the same dosage for years. Patient reports he has noticed a slight increase in flatulence with the metformin but denies diarrhea or vomiting. Patient states that he has been intermittent checking his fasting blood sugars in the morning with range being 85-117. Patient also does report that he knows he has low levels of vitamin D and has a perception to take vitamin D, but does not always take.  Denies chest pain, shortness of breath, dizziness, abdominal pain, dysuria, extremity pain, extremity swelling or rash. Denies recent sickness. Denies recent antibiotic use.   Duanne LimerickJones, Deanna C, MD: PCP   Past Medical History:  Diagnosis Date  . Allergy   . Anxiety   . Depression   . Kidney stones   . OCD (obsessive compulsive disorder)   . Sleep apnea     Patient  Active Problem List   Diagnosis Date Noted  . Gallstones 08/19/2015  . Fatty liver disease, nonalcoholic 08/19/2015  . Vitamin D deficiency 08/11/2015  . OCD (obsessive compulsive disorder) 08/08/2015  . Allergic rhinitis 08/08/2015  . Nephrolithiasis 08/08/2015  . Prediabetes 08/08/2015  . OSA treated with BiPAP 08/08/2015  . Obesity, Class II, BMI 35-39.9, with comorbidity 08/08/2015    Past Surgical History:  Procedure Laterality Date  . INTERNAL URETHROTOMY     remove kidney stone  . LITHOTRIPSY    . LITHOTRIPSY     x 7     No current facility-administered medications for this encounter.   Current Outpatient Prescriptions:  .  ARIPiprazole (ABILIFY) 5 MG tablet, Take 5 mg by mouth daily. Dr Percell BeltMillet, Disp: , Rfl:  .  cetirizine (ZYRTEC) 10 MG tablet, Take 10 mg by mouth daily. otc, Disp: , Rfl:  .  Cholecalciferol 5000 units capsule, Take 1 capsule (5,000 Units total) by mouth daily., Disp: 90 capsule, Rfl: 3 .  clonazePAM (KLONOPIN) 2 MG tablet, Take 1 tablet by mouth at bedtime. Dr Percell BeltMillet, Disp: , Rfl:  .  fluvoxaMINE (LUVOX) 100 MG tablet, Take 100 mg by mouth 2 (two) times daily. Dr Percell BeltMillet, Disp: , Rfl:  .  metFORMIN (GLUCOPHAGE) 500 MG tablet, Take 1 tablet (500 mg total) by mouth daily with breakfast., Disp: 30 tablet, Rfl: 6  Allergies Septra [sulfamethoxazole-trimethoprim] and Sulfa antibiotics  Family History  Problem Relation Age of Onset  . Osteoarthritis Mother   . Hyperlipidemia Father   . Osteoarthritis Father   . Hypertension Father   .  Hypertension Sister   . Cancer Maternal Aunt   . Diabetes Paternal Aunt   . Diabetes Paternal Grandmother     Social History Social History  Substance Use Topics  . Smoking status: Never Smoker  . Smokeless tobacco: Never Used  . Alcohol use No    Review of Systems Constitutional: No fever/chills Eyes: No visual changes. ENT: No sore throat. Cardiovascular: Denies chest pain. Respiratory: Denies shortness  of breath. Gastrointestinal: No abdominal pain.  No nausea, no vomiting.  No diarrhea.  No constipation. Genitourinary: Negative for dysuria. Musculoskeletal: Negative for back pain. Skin: Negative for rash. Neurological: Negative for headaches, focal weakness or numbness.  ____________________________________________   PHYSICAL EXAM:  VITAL SIGNS: ED Triage Vitals  Enc Vitals Group     BP 01/01/17 1308 130/75     Pulse Rate 01/01/17 1308 83     Resp 01/01/17 1308 18     Temp 01/01/17 1308 98.2 F (36.8 C)     Temp Source 01/01/17 1308 Oral     SpO2 01/01/17 1308 97 %     Weight 01/01/17 1310 270 lb (122.5 kg)     Height 01/01/17 1310 5\' 10"  (1.778 m)     Head Circumference --      Peak Flow --      Pain Score --      Pain Loc --      Pain Edu? --      Excl. in GC? --     Constitutional: Alert and oriented. Well appearing and in no acute distress. ENT      Head: Normocephalic and atraumatic. Cardiovascular: Normal rate, regular rhythm. Grossly normal heart sounds.  Good peripheral circulation. Respiratory: Normal respiratory effort without tachypnea nor retractions. Breath sounds are clear and equal bilaterally. No wheezes, rales, rhonchi. Gastrointestinal: Soft and nontender. Obese abdomen. Musculoskeletal: Ambulatory with steady gait. No midline cervical, thoracic or lumbar tenderness to palpation.  Neurologic:  Normal speech and language. No gross focal neurologic deficits are appreciated. Speech is normal. No gait instability.  Skin:  Skin is warm, dry and intact. No rash noted. Psychiatric: Mood and affect are normal. Speech and behavior are normal. Patient exhibits appropriate insight and judgment   ___________________________________________   LABS (all labs ordered are listed, but only abnormal results are displayed)  Labs Reviewed  COMPREHENSIVE METABOLIC PANEL - Abnormal; Notable for the following:       Result Value   Glucose, Bld 108 (*)    All other  components within normal limits  CBC WITH DIFFERENTIAL/PLATELET    RADIOLOGY  No results found. ____________________________________________   PROCEDURES Procedures   INITIAL IMPRESSION / ASSESSMENT AND PLAN / ED COURSE  Pertinent labs & imaging results that were available during my care of the patient were reviewed by me and considered in my medical decision making (see chart for details).  Very well-appearing patient. No acute distress. Patient expressed concern sleepiness being caused by metformin. Denies other triggers are changes. Reports otherwise feels well. Discussed with lab, unable to run in-house lactic acid at this time in urgent care. Will evaluate CBC and CMP. Labs reviewed and results discussed with patient. Labs reassuring and unremarkable. Discussed with patient we'll recommend stopping metformin and followed up with his primary care physician. Encouraged patient to take vitamin D as directed. Encouraged rest, fluids and PCP follow-up. Discussed with patient to continue to monitor for other triggers.  Discussed follow up with Primary care physician this week. Discussed follow up and return parameters  including no resolution or any worsening concerns. Patient verbalized understanding and agreed to plan.   ____________________________________________   FINAL CLINICAL IMPRESSION(S) / ED DIAGNOSES  Final diagnoses:  Medication intolerance     Discharge Medication List as of 01/01/2017  2:38 PM      Note: This dictation was prepared with Dragon dictation along with smaller phrase technology. Any transcriptional errors that result from this process are unintentional.         Renford Dills, NP 01/01/17 1626

## 2017-01-04 ENCOUNTER — Other Ambulatory Visit: Payer: Self-pay

## 2017-01-13 ENCOUNTER — Ambulatory Visit (INDEPENDENT_AMBULATORY_CARE_PROVIDER_SITE_OTHER): Payer: 59 | Admitting: Family Medicine

## 2017-01-13 ENCOUNTER — Encounter: Payer: Self-pay | Admitting: Family Medicine

## 2017-01-13 VITALS — BP 118/80 | HR 83 | Resp 16 | Wt 277.1 lb

## 2017-01-13 DIAGNOSIS — R7303 Prediabetes: Secondary | ICD-10-CM

## 2017-01-13 NOTE — Progress Notes (Signed)
Name: Richard Frost   MRN: [id number]    DOB: 04-Jun-1971   Date:01/13/2017       Progress Note  Subjective  Chief Complaint  Chief Complaint  Patient presents with  . Diabetes    Metformin issues- thought  med was casuing fatigue. Could also be sleep apnea but Jeun[facility] ios following him for that.     Diabetes  He presents for his follow-up diabetic visit. Diabetes type: metabolic syndrome. His disease course has been stable. Pertinent negatives for hypoglycemia include no confusion, dizziness, headaches, mood changes, nervousness/anxiousness, pallor, seizures, sleepiness, speech difficulty, sweats or tremors. Associated symptoms include polyuria. Pertinent negatives for diabetes include no blurred vision, no chest pain, no fatigue, no foot paresthesias, no foot ulcerations, no polydipsia, no polyphagia, no visual change, no weakness and no weight loss. There are no hypoglycemic complications. Symptoms are stable. There are no diabetic complications. There are no known risk factors for coronary artery disease. Current diabetic treatment includes oral agent (monotherapy). He is following a generally healthy diet. He participates in exercise daily. His breakfast blood glucose is taken between 8-9 am. His breakfast blood glucose range is generally 90-110 mg/dl.    No problem-specific Assessment & Plan notes found for this encounter.   Past Medical History:  Diagnosis Date  . Allergy   . Anxiety   . Depression   . Kidney stones   . OCD (obsessive compulsive disorder)   . Sleep apnea     Past Surgical History:  Procedure Laterality Date  . INTERNAL URETHROTOMY     remove kidney stone  . LITHOTRIPSY    . LITHOTRIPSY     x 7    Family History  Problem Relation Age of Onset  . Osteoarthritis Mother   . Hyperlipidemia Father   . Osteoarthritis Father   . Hypertension Father   . Hypertension Sister   . Cancer Maternal Aunt   . Diabetes Paternal Aunt   . Diabetes Paternal  Grandmother     Social History   Social History  . Marital status: Married    Spouse name: N/A  . Number of children: N/A  . Years of education: N/A   Occupational History  . Not on file.   Social History Main Topics  . Smoking status: Never Smoker  . Smokeless tobacco: Never Used  . Alcohol use No  . Drug use: No  . Sexual activity: Not Currently   Other Topics Concern  . Not on file   Social History Narrative   ** Merged History Encounter **        Allergies  Allergen Reactions  . Septra [Sulfamethoxazole-Trimethoprim] Hives  . Sulfa Antibiotics     Outpatient Medications Prior to Visit  Medication Sig Dispense Refill  . ARIPiprazole (ABILIFY) 5 MG tablet Take 5 mg by mouth daily. Dr Percell Belt    . cetirizine (ZYRTEC) 10 MG tablet Take 10 mg by mouth daily. otc    . Cholecalciferol 5000 units capsule Take 1 capsule (5,000 Units total) by mouth daily. 90 capsule 3  . clonazePAM (KLONOPIN) 2 MG tablet Take 1 tablet by mouth at bedtime. Dr Percell Belt    . fluvoxaMINE (LUVOX) 100 MG tablet Take 100 mg by mouth 2 (two) times daily. Dr Percell Belt    . metFORMIN (GLUCOPHAGE) 500 MG tablet Take 1 tablet (500 mg total) by mouth daily with breakfast. 30 tablet 6   No facility-administered medications prior to visit.     Review of Systems  Constitutional: Negative  for chills, fatigue, fever, malaise/fatigue and weight loss.  HENT: Negative for ear discharge, ear pain and sore throat.   Eyes: Negative for blurred vision.  Respiratory: Negative for cough, sputum production, shortness of breath and wheezing.   Cardiovascular: Negative for chest pain, palpitations and leg swelling.  Gastrointestinal: Negative for abdominal pain, blood in stool, constipation, diarrhea, heartburn, melena and nausea.  Genitourinary: Negative for dysuria, frequency, hematuria and urgency.  Musculoskeletal: Negative for back pain, joint pain, myalgias and neck pain.  Skin: Negative for pallor and rash.   Neurological: Negative for dizziness, tingling, tremors, sensory change, focal weakness, seizures, speech difficulty, weakness and headaches.  Endo/Heme/Allergies: Negative for environmental allergies, polydipsia and polyphagia. Does not bruise/bleed easily.  Psychiatric/Behavioral: Negative for confusion, depression and suicidal ideas. The patient is not nervous/anxious and does not have insomnia.      Objective  Vitals:   01/13/17 1037  BP: 118/80  Pulse: 83  Resp: 16  SpO2: 97%  Weight: 277 lb 1.6 oz (125.7 kg)    Physical Exam  Constitutional: He is oriented to person, place, and time and well-developed, well-nourished, and in no distress.  HENT:  Head: Normocephalic.  Right Ear: External ear normal.  Left Ear: External ear normal.  Nose: Nose normal.  Mouth/Throat: Oropharynx is clear and moist.  Eyes: Conjunctivae and EOM are normal. Pupils are equal, round, and reactive to light. Right eye exhibits no discharge. Left eye exhibits no discharge. No scleral icterus.  Neck: Normal range of motion. Neck supple. No JVD present. No tracheal deviation present. No thyromegaly present.  Cardiovascular: Normal rate, regular rhythm, normal heart sounds and intact distal pulses.  Exam reveals no gallop and no friction rub.   No murmur heard. Pulmonary/Chest: Breath sounds normal. No respiratory distress. He has no wheezes. He has no rales.  Abdominal: Soft. Bowel sounds are normal. He exhibits no mass. There is no hepatosplenomegaly. There is no tenderness. There is no rebound, no guarding and no CVA tenderness.  Musculoskeletal: Normal range of motion. He exhibits no edema or tenderness.  Lymphadenopathy:    He has no cervical adenopathy.  Neurological: He is alert and oriented to person, place, and time. He has normal sensation, normal strength, normal reflexes and intact cranial nerves. No cranial nerve deficit.  Skin: Skin is warm. No rash noted.  Psychiatric: Mood and affect  normal.  Nursing note and vitals reviewed.     Assessment & Plan  Problem List Items Addressed This Visit      Other   Prediabetes - Primary      No orders of the defined types were placed in this encounter.     Dr. Hayden Rasmusseneanna Brinnley Lacap [facility]  01/13/17

## 2017-01-15 ENCOUNTER — Other Ambulatory Visit: Payer: Self-pay | Admitting: Family Medicine

## 2017-02-01 ENCOUNTER — Ambulatory Visit: Payer: Self-pay | Admitting: Family Medicine

## 2017-02-11 ENCOUNTER — Ambulatory Visit: Payer: 59 | Attending: Otolaryngology

## 2017-02-11 DIAGNOSIS — R400 Somnolence: Secondary | ICD-10-CM | POA: Diagnosis present

## 2017-02-11 DIAGNOSIS — G4730 Sleep apnea, unspecified: Secondary | ICD-10-CM | POA: Diagnosis present

## 2017-02-11 DIAGNOSIS — G4733 Obstructive sleep apnea (adult) (pediatric): Secondary | ICD-10-CM | POA: Insufficient documentation

## 2017-02-11 DIAGNOSIS — R0683 Snoring: Secondary | ICD-10-CM | POA: Diagnosis present

## 2017-03-03 ENCOUNTER — Encounter: Payer: Self-pay | Admitting: Family Medicine

## 2017-03-03 ENCOUNTER — Ambulatory Visit (INDEPENDENT_AMBULATORY_CARE_PROVIDER_SITE_OTHER): Payer: 59 | Admitting: Family Medicine

## 2017-03-03 VITALS — BP 120/80 | HR 72 | Ht 67.0 in | Wt 262.0 lb

## 2017-03-03 DIAGNOSIS — R7303 Prediabetes: Secondary | ICD-10-CM

## 2017-03-03 NOTE — Progress Notes (Signed)
Name: Richard Frost   MRN: [id number]    DOB: 09/24/1970   Date:03/03/2017       Progress Note  Subjective  Chief Complaint  Chief Complaint  Patient presents with  . Diabetes    follow up for prediabetes/ has metformin "on hand in case needs to start"    Diabetes  He presents for his follow-up diabetic visit. He has type 2 diabetes mellitus. His disease course has been stable. There are no hypoglycemic associated symptoms. Pertinent negatives for hypoglycemia include no confusion, dizziness, headaches or nervousness/anxiousness. Pertinent negatives for diabetes include no blurred vision, no chest pain, no fatigue, no foot paresthesias, no foot ulcerations, no polydipsia, no polyphagia, no polyuria, no visual change, no weakness and no weight loss. There are no hypoglycemic complications. Symptoms are stable. Risk factors for coronary artery disease include diabetes mellitus.    No problem-specific Assessment & Plan notes found for this encounter.   Past Medical History:  Diagnosis Date  . Allergy   . Anxiety   . Depression   . Kidney stones   . OCD (obsessive compulsive disorder)   . Sleep apnea     Past Surgical History:  Procedure Laterality Date  . INTERNAL URETHROTOMY     remove kidney stone  . LITHOTRIPSY    . LITHOTRIPSY     x 7    Family History  Problem Relation Age of Onset  . Osteoarthritis Mother   . Hyperlipidemia Father   . Osteoarthritis Father   . Hypertension Father   . Hypertension Sister   . Cancer Maternal Aunt   . Diabetes Paternal Aunt   . Diabetes Paternal Grandmother     Social History   Social History  . Marital status: Married    Spouse name: N/A  . Number of children: N/A  . Years of education: N/A   Occupational History  . Not on file.   Social History Main Topics  . Smoking status: Never Smoker  . Smokeless tobacco: Never Used  . Alcohol use No  . Drug use: No  . Sexual activity: Not Currently   Other Topics Concern   . Not on file   Social History Narrative   ** Merged History Encounter **        Allergies  Allergen Reactions  . Septra [Sulfamethoxazole-Trimethoprim] Hives  . Sulfa Antibiotics     Outpatient Medications Prior to Visit  Medication Sig Dispense Refill  . ARIPiprazole (ABILIFY) 5 MG tablet Take 5 mg by mouth daily. Dr Percell BeltMillet    . cetirizine (ZYRTEC) 10 MG tablet Take 10 mg by mouth daily. otc    . Cholecalciferol 5000 units capsule Take 1 capsule (5,000 Units total) by mouth daily. 90 capsule 3  . clonazePAM (KLONOPIN) 2 MG tablet Take 1 tablet by mouth at bedtime. Dr Percell BeltMillet    . fluvoxaMINE (LUVOX) 100 MG tablet Take 100 mg by mouth 2 (two) times daily. Dr Percell BeltMillet    . ONETOUCH VERIO test strip TEST BLOOD SUGAR ONCE DAILY 50 each 11   No facility-administered medications prior to visit.     Review of Systems  Constitutional: Negative for chills, fatigue, fever, malaise/fatigue and weight loss.  HENT: Negative for ear discharge, ear pain and sore throat.   Eyes: Negative for blurred vision.  Respiratory: Negative for cough, sputum production, shortness of breath and wheezing.   Cardiovascular: Negative for chest pain, palpitations and leg swelling.  Gastrointestinal: Negative for abdominal pain, blood in stool, constipation, diarrhea, heartburn,  melena and nausea.  Genitourinary: Negative for dysuria, frequency, hematuria and urgency.  Musculoskeletal: Negative for back pain, joint pain, myalgias and neck pain.  Skin: Negative for rash.  Neurological: Negative for dizziness, tingling, sensory change, focal weakness, weakness and headaches.  Endo/Heme/Allergies: Negative for environmental allergies, polydipsia and polyphagia. Does not bruise/bleed easily.  Psychiatric/Behavioral: Negative for confusion, depression and suicidal ideas. The patient is not nervous/anxious and does not have insomnia.      Objective  Vitals:   03/03/17 0934  BP: 120/80  Pulse: 72  Weight:  262 lb (118.8 kg)  Height: 5\' 7"  (1.702 m)    Physical Exam  Constitutional: He is oriented to person, place, and time and well-developed, well-nourished, and in no distress.  HENT:  Head: Normocephalic.  Right Ear: External ear normal.  Left Ear: External ear normal.  Nose: Nose normal.  Mouth/Throat: Oropharynx is clear and moist.  Eyes: Pupils are equal, round, and reactive to light. Conjunctivae and EOM are normal. Right eye exhibits no discharge. Left eye exhibits no discharge. No scleral icterus.  Neck: Normal range of motion. Neck supple. No JVD present. No tracheal deviation present. No thyromegaly present.  Cardiovascular: Normal rate, regular rhythm, normal heart sounds and intact distal pulses.  Exam reveals no gallop and no friction rub.   No murmur heard. Pulmonary/Chest: Breath sounds normal. No respiratory distress. He has no wheezes. He has no rales.  Abdominal: Soft. Bowel sounds are normal. He exhibits no mass. There is no hepatosplenomegaly. There is no tenderness. There is no rebound, no guarding and no CVA tenderness.  Musculoskeletal: Normal range of motion. He exhibits no edema or tenderness.  Lymphadenopathy:    He has no cervical adenopathy.  Neurological: He is alert and oriented to person, place, and time. He has normal sensation, normal strength, normal reflexes and intact cranial nerves. No cranial nerve deficit.  Skin: Skin is warm. No rash noted.  Psychiatric: Mood and affect normal.  Nursing note and vitals reviewed.     Assessment & Plan  Problem List Items Addressed This Visit      Other   Prediabetes - Primary   Relevant Orders   Hemoglobin A1c   Lipid Profile   Renal Function Panel      No orders of the defined types were placed in this encounter.     Dr. Hayden Rasmusseneanna Euclid Cassetta [facility]  03/03/17

## 2017-03-04 LAB — RENAL FUNCTION PANEL
ALBUMIN: 4.7 g/dL (ref 3.5–5.5)
BUN/Creatinine Ratio: 10 (ref 9–20)
BUN: 11 mg/dL (ref 6–24)
CALCIUM: 9.5 mg/dL (ref 8.7–10.2)
CHLORIDE: 102 mmol/L (ref 96–106)
CO2: 23 mmol/L (ref 20–29)
Creatinine, Ser: 1.08 mg/dL (ref 0.76–1.27)
GFR calc non Af Amer: 81 mL/min/{1.73_m2} (ref >59)
GFR, EST AFRICAN AMERICAN: 94 mL/min/{1.73_m2} (ref >59)
Glucose: 81 mg/dL (ref 65–99)
Phosphorus: 2.7 mg/dL (ref 2.5–4.5)
Potassium: 4.2 mmol/L (ref 3.5–5.2)
Sodium: 143 mmol/L (ref 134–144)

## 2017-03-04 LAB — HEMOGLOBIN A1C
ESTIMATED AVERAGE GLUCOSE: 114 mg/dL
Hgb A1c MFr Bld: 5.6 % (ref 4.8–5.6)

## 2017-03-04 LAB — LIPID PANEL
CHOLESTEROL TOTAL: 219 mg/dL — AB (ref 100–199)
Chol/HDL Ratio: 3.8 ratio (ref 0.0–5.0)
HDL: 57 mg/dL (ref >39)
LDL Calculated: 127 mg/dL — ABNORMAL HIGH (ref 0–99)
Triglycerides: 176 mg/dL — ABNORMAL HIGH (ref 0–149)
VLDL CHOLESTEROL CAL: 35 mg/dL (ref 5–40)

## 2017-03-21 ENCOUNTER — Other Ambulatory Visit: Payer: Self-pay

## 2017-03-21 ENCOUNTER — Ambulatory Visit (INDEPENDENT_AMBULATORY_CARE_PROVIDER_SITE_OTHER): Payer: 59 | Admitting: Family Medicine

## 2017-03-21 ENCOUNTER — Encounter: Payer: Self-pay | Admitting: Family Medicine

## 2017-03-21 VITALS — BP 120/62 | HR 70 | Ht 67.0 in | Wt 257.0 lb

## 2017-03-21 DIAGNOSIS — E669 Obesity, unspecified: Secondary | ICD-10-CM

## 2017-03-21 DIAGNOSIS — IMO0001 Reserved for inherently not codable concepts without codable children: Secondary | ICD-10-CM

## 2017-03-21 DIAGNOSIS — K8020 Calculus of gallbladder without cholecystitis without obstruction: Secondary | ICD-10-CM | POA: Diagnosis not present

## 2017-03-21 NOTE — Progress Notes (Signed)
Name: Richard Frost   MRN: [id number]    DOB: 1971/05/08   Date:03/21/2017       Progress Note  Subjective  Chief Complaint  Chief Complaint  Patient presents with  . Abdominal Pain    currently is not having abdominal pain, but had an episode starting Friday night around 10 pm and lasted til 6am Sat morning. From 10 to 2 was the worse pain. US showed multiple gallstaones, no gallbladder wall thickening.    Abdominal Pain  This is a recurrent problem. The current episode started in the past 7 days (early saturday morning). The onset quality is sudden. Episode frequency: one episode. The most recent episode lasted 4 hours. The problem has been gradually improving. The pain is located in the epigastric region. The pain is at a severity of 9/10. The pain is severe (duration 4 hours). The quality of the pain is cramping. The abdominal pain does not radiate. Associated symptoms include nausea. Pertinent negatives include no constipation, diarrhea, dysuria, fever, frequency, headaches, hematochezia, hematuria, melena, myalgias, vomiting or weight loss. The pain is aggravated by palpation. The pain is relieved by nothing. He has tried acetaminophen (tums) for the symptoms. The treatment provided no relief. Prior diagnostic workup includes ultrasound. His past medical history is significant for gallstones.    No problem-specific Assessment & Plan notes found for this encounter.   Past Medical History:  Diagnosis Date  . Allergy   . Anxiety   . Depression   . Kidney stones   . OCD (obsessive compulsive disorder)   . Sleep apnea     Past Surgical History:  Procedure Laterality Date  . INTERNAL URETHROTOMY     remove kidney stone  . LITHOTRIPSY    . LITHOTRIPSY     x 7    Family History  Problem Relation Age of Onset  . Osteoarthritis Mother   . Hyperlipidemia Father   . Osteoarthritis Father   . Hypertension Father   . Hypertension Sister   . Cancer Maternal Aunt   . Diabetes  Paternal Aunt   . Diabetes Paternal Grandmother     Social History   Social History  . Marital status: Married    Spouse name: N/A  . Number of children: N/A  . Years of education: N/A   Occupational History  . Not on file.   Social History Main Topics  . Smoking status: Never Smoker  . Smokeless tobacco: Never Used  . Alcohol use No  . Drug use: No  . Sexual activity: Not Currently   Other Topics Concern  . Not on file   Social History Narrative   ** Merged History Encounter **        Allergies  Allergen Reactions  . Septra [Sulfamethoxazole-Trimethoprim] Hives  . Sulfa Antibiotics     Outpatient Medications Prior to Visit  Medication Sig Dispense Refill  . ARIPiprazole (ABILIFY) 5 MG tablet Take 5 mg by mouth daily. Dr Percell Belt    . cetirizine (ZYRTEC) 10 MG tablet Take 10 mg by mouth daily. otc    . Cholecalciferol 5000 units capsule Take 1 capsule (5,000 Units total) by mouth daily. 90 capsule 3  . clonazePAM (KLONOPIN) 2 MG tablet Take 1 tablet by mouth at bedtime. Dr Percell Belt    . fluvoxaMINE (LUVOX) 100 MG tablet Take 100 mg by mouth 2 (two) times daily. Dr Percell Belt    . ONETOUCH VERIO test strip TEST BLOOD SUGAR ONCE DAILY 50 each 11   No facility-administered  medications prior to visit.     Review of Systems  Constitutional: Negative for chills, fever, malaise/fatigue and weight loss.  HENT: Negative for ear discharge, ear pain and sore throat.   Eyes: Negative for blurred vision.  Respiratory: Negative for cough, sputum production, shortness of breath and wheezing.   Cardiovascular: Negative for chest pain, palpitations and leg swelling.  Gastrointestinal: Positive for abdominal pain and nausea. Negative for blood in stool, constipation, diarrhea, heartburn, hematochezia, melena and vomiting.  Genitourinary: Negative for dysuria, frequency, hematuria and urgency.  Musculoskeletal: Negative for back pain, joint pain, myalgias and neck pain.  Skin: Negative  for rash.  Neurological: Negative for dizziness, tingling, sensory change, focal weakness and headaches.  Endo/Heme/Allergies: Negative for environmental allergies and polydipsia. Does not bruise/bleed easily.  Psychiatric/Behavioral: Negative for depression and suicidal ideas. The patient is not nervous/anxious and does not have insomnia.      Objective  Vitals:   03/21/17 1419  BP: 120/62  Pulse: 70  Weight: 257 lb (116.6 kg)  Height: 5\' 7"  (1.702 m)    Physical Exam  Constitutional: He is oriented to person, place, and time and well-developed, well-nourished, and in no distress.  HENT:  Head: Normocephalic.  Right Ear: External ear normal.  Left Ear: External ear normal.  Nose: Nose normal.  Mouth/Throat: Oropharynx is clear and moist.  Eyes: Pupils are equal, round, and reactive to light. Conjunctivae and EOM are normal. Right eye exhibits no discharge. Left eye exhibits no discharge. No scleral icterus.  Neck: Normal range of motion. Neck supple. No JVD present. No tracheal deviation present. No thyromegaly present.  Cardiovascular: Normal rate, regular rhythm, normal heart sounds and intact distal pulses.  Exam reveals no gallop and no friction rub.   No murmur heard. Pulmonary/Chest: Breath sounds normal. No respiratory distress. He has no wheezes. He has no rales.  Abdominal: Soft. Bowel sounds are normal. He exhibits no mass. There is no hepatosplenomegaly. There is no tenderness. There is no rigidity, no rebound, no guarding and no CVA tenderness.  Musculoskeletal: Normal range of motion. He exhibits no edema or tenderness.  Lymphadenopathy:    He has no cervical adenopathy.  Neurological: He is alert and oriented to person, place, and time. He has normal sensation, normal strength, normal reflexes and intact cranial nerves. No cranial nerve deficit.  Skin: Skin is warm. No rash noted.  Psychiatric: Mood and affect normal.  Nursing note and vitals  reviewed.     Assessment & Plan  Problem List Items Addressed This Visit      Other   Obesity, Class II, BMI 35-39.9, with comorbidity    Other Visit Diagnoses    Calculus of gallbladder without cholecystitis without obstruction    -  Primary   Relevant Orders   Hepatic Function Panel (6)   Lipase   CBC with Differential/Platelet   Ambulatory referral to General Surgery      No orders of the defined types were placed in this encounter.     Dr. Hayden Rasmusseneanna Kenyan Karnes [facility]  03/21/17

## 2017-03-21 NOTE — Patient Instructions (Signed)

## 2017-03-22 LAB — CBC WITH DIFFERENTIAL/PLATELET
BASOS ABS: 0 10*3/uL (ref 0.0–0.2)
Basos: 1 %
EOS (ABSOLUTE): 0.2 10*3/uL (ref 0.0–0.4)
Eos: 4 %
Hematocrit: 45 % (ref 37.5–51.0)
Hemoglobin: 15.6 g/dL (ref 13.0–17.7)
IMMATURE GRANS (ABS): 0 10*3/uL (ref 0.0–0.1)
IMMATURE GRANULOCYTES: 0 %
LYMPHS: 39 %
Lymphocytes Absolute: 2.3 10*3/uL (ref 0.7–3.1)
MCH: 30.5 pg (ref 26.6–33.0)
MCHC: 34.7 g/dL (ref 31.5–35.7)
MCV: 88 fL (ref 79–97)
MONOCYTES: 10 %
Monocytes Absolute: 0.6 10*3/uL (ref 0.1–0.9)
NEUTROS ABS: 2.7 10*3/uL (ref 1.4–7.0)
NEUTROS PCT: 46 %
PLATELETS: 209 10*3/uL (ref 150–379)
RBC: 5.11 x10E6/uL (ref 4.14–5.80)
RDW: 14.5 % (ref 12.3–15.4)
WBC: 5.9 10*3/uL (ref 3.4–10.8)

## 2017-03-22 LAB — HEPATIC FUNCTION PANEL (6)
ALT: 867 IU/L — AB (ref 0–44)
AST: 301 IU/L — ABNORMAL HIGH (ref 0–40)
Albumin: 4.7 g/dL (ref 3.5–5.5)
Alkaline Phosphatase: 203 IU/L — ABNORMAL HIGH (ref 39–117)
BILIRUBIN, DIRECT: 0.42 mg/dL — AB (ref 0.00–0.40)
Bilirubin Total: 0.9 mg/dL (ref 0.0–1.2)

## 2017-03-22 LAB — LIPASE: LIPASE: 23 U/L (ref 13–78)

## 2017-03-23 ENCOUNTER — Encounter: Payer: Self-pay | Admitting: Surgery

## 2017-03-23 ENCOUNTER — Ambulatory Visit (INDEPENDENT_AMBULATORY_CARE_PROVIDER_SITE_OTHER): Payer: 59 | Admitting: Surgery

## 2017-03-23 ENCOUNTER — Telehealth: Payer: Self-pay

## 2017-03-23 ENCOUNTER — Other Ambulatory Visit
Admission: RE | Admit: 2017-03-23 | Discharge: 2017-03-23 | Disposition: A | Discharge status: Home / Self Care | Payer: 59 | Source: Ambulatory Visit | Attending: Surgery | Admitting: Surgery

## 2017-03-23 VITALS — BP 130/82 | HR 90 | Temp 98.3°F | Wt 260.0 lb

## 2017-03-23 DIAGNOSIS — K8040 Calculus of bile duct with cholecystitis, unspecified, without obstruction: Secondary | ICD-10-CM

## 2017-03-23 DIAGNOSIS — R1013 Epigastric pain: Secondary | ICD-10-CM | POA: Diagnosis not present

## 2017-03-23 LAB — COMPREHENSIVE METABOLIC PANEL
ALBUMIN: 4.3 g/dL (ref 3.5–5.0)
ALT: 453 U/L — AB (ref 17–63)
AST: 92 U/L — AB (ref 15–41)
Alkaline Phosphatase: 136 U/L — ABNORMAL HIGH (ref 38–126)
Anion gap: 9 (ref 5–15)
BUN: 11 mg/dL (ref 6–20)
CHLORIDE: 105 mmol/L (ref 101–111)
CO2: 27 mmol/L (ref 22–32)
CREATININE: 0.99 mg/dL (ref 0.61–1.24)
Calcium: 9.4 mg/dL (ref 8.9–10.3)
GFR calc Af Amer: >60 mL/min (ref >60)
Glucose, Bld: 90 mg/dL (ref 65–99)
POTASSIUM: 4.1 mmol/L (ref 3.5–5.1)
SODIUM: 141 mmol/L (ref 135–145)
Total Bilirubin: 0.6 mg/dL (ref 0.3–1.2)
Total Protein: 7.6 g/dL (ref 6.5–8.1)

## 2017-03-23 LAB — CBC WITH DIFFERENTIAL/PLATELET
Basophils Absolute: 0 10*3/uL (ref 0–0.1)
Basophils Relative: 1 %
Eosinophils Absolute: 0.2 10*3/uL (ref 0–0.7)
Eosinophils Relative: 3 %
HCT: 45.6 % (ref 40.0–52.0)
HEMOGLOBIN: 15.1 g/dL (ref 13.0–18.0)
LYMPHS ABS: 1.8 10*3/uL (ref 1.0–3.6)
Lymphocytes Relative: 35 %
MCH: 29.8 pg (ref 26.0–34.0)
MCHC: 33.1 g/dL (ref 32.0–36.0)
MCV: 90.1 fL (ref 80.0–100.0)
MONOS PCT: 11 %
Monocytes Absolute: 0.6 10*3/uL (ref 0.2–1.0)
Neutro Abs: 2.7 10*3/uL (ref 1.4–6.5)
Neutrophils Relative %: 50 %
Platelets: 187 10*3/uL (ref 150–440)
RBC: 5.05 MIL/uL (ref 4.40–5.90)
RDW: 14.4 % (ref 11.5–14.5)
WBC: 5.3 10*3/uL (ref 3.8–10.6)

## 2017-03-23 LAB — LIPASE, BLOOD: Lipase: 25 U/L (ref 11–51)

## 2017-03-23 NOTE — Telephone Encounter (Signed)
Called patient to let him know that his labs still showed that his liver enzymes were elevated. Therefore, Dr. Excell Seltzerooper recommended him to schedule his surgery electively. Patient agreed and his surgery will be scheduled for 04/05/2017. Blues sheet information was given by phone and I will also mail him a copy. Patient understood and had no further questions.

## 2017-03-23 NOTE — Patient Instructions (Addendum)
We have ordered some labs to be drawn today. Please proceed to the Medical Mall to have these tests completed prior to leaving today. You will check in at the registration desk in the medical mall. Please see walking directions below if needed.  We will call you with the results and next step in plan of care as soon as results are received.   Directions to Medical Mall: When leaving our office, go right. Go all of the way down to the very end of the hallway. You will have a purple wall in front of you. You will now have a tunnel to the hospital on your left hand side. Go through this tunnel and the elevators will be on your left. Go down to the 1st floor and take a slight left. The very first desk on the right hand side is the registration desk.  Please look at your bleu sheet in case you have any questions or concerns. Your surgery will be [facility] for 04/05/2017 [facility]

## 2017-03-23 NOTE — Progress Notes (Signed)
Richard Frost is an 46 y.o. male.   Chief Complaint: Gallstones HPI: This patient referred over for gallbladder problems by Dr. Elizabeth Sauereanna Jones for consultation. 5 days ago the. Patient experienced several hours of abdominal pain but no back pain. It was following a fatty meal. He had no fevers or chills and his pain is now resolved. On the 13th 2 days ago he had elevated liver function tests on laboratory values. He has not had a repeat ultrasound that he has confirmed gallstones from a year and a half ago.  His father has gallbladder disease.  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 He's been diagnosed with diabetes but has only been off and on medication based on his hemoglobin A1c denies hypertension or other medical problems. He's had no abdominal surgeries.  Past Medical History:  Diagnosis Date  . Allergy   . Anxiety   . Depression   . Kidney stones   . OCD (obsessive compulsive disorder)   . Sleep apnea     Past Surgical History:  Procedure Laterality Date  . INTERNAL URETHROTOMY     remove kidney stone  . LITHOTRIPSY    . LITHOTRIPSY     x 7    Family History  Problem Relation Age of Onset  . Osteoarthritis Mother   . Hyperlipidemia Father   . Osteoarthritis Father   . Hypertension Father   . Hypertension Sister   . Cancer Maternal Aunt   . Diabetes Paternal Aunt   . Diabetes Paternal Grandmother    Social History:  reports that he has never smoked. He has never used smokeless tobacco. He reports that he does not drink alcohol or use drugs.  Allergies:  Allergies  Allergen Reactions  . Septra [Sulfamethoxazole-Trimethoprim] Hives  . Sulfa Antibiotics Hives     (Not in a hospital admission)   Review of Systems:   Review of Systems  Constitutional: Negative.   HENT: Negative.   Eyes: Negative.   Respiratory: Negative.   Cardiovascular: Negative.   Gastrointestinal: Positive for abdominal pain and nausea. Negative for constipation, diarrhea,  heartburn and vomiting.  Genitourinary: Negative.   Musculoskeletal: Negative.   Skin: Negative.   Neurological: Negative.   Endo/Heme/Allergies: Negative.   Psychiatric/Behavioral: Negative.     Physical Exam:  Physical Exam  Constitutional: He is oriented to person, place, and time and well-developed, well-nourished, and in no distress. No distress.  HENT:  Head: Normocephalic and atraumatic.  Eyes: Pupils are equal, round, and reactive to light. Right eye exhibits no discharge. Left eye exhibits no discharge. No scleral icterus.  Neck: Normal range of motion. No JVD present.  Cardiovascular: Normal rate, regular rhythm and normal heart sounds.   Pulmonary/Chest: Effort normal. No respiratory distress. He has no wheezes. He has no rales.  Abdominal: Soft. He exhibits no distension. There is no tenderness. There is no rebound and no guarding.  Negative Murphy sign  Musculoskeletal: Normal range of motion. He exhibits no edema or tenderness.  Lymphadenopathy:    He has no cervical adenopathy.  Neurological: He is alert and oriented to person, place, and time.  Twitching of his lower lip and jaw.  Skin: Skin is warm and dry. No rash noted. He is not diaphoretic. No erythema.  Psychiatric: Mood and affect normal.  Vitals reviewed.   There were no vitals taken for this visit.    Results for orders placed or performed in visit on 03/21/17 (from the past 48 hour(s))  Hepatic Function  Panel (6)     Status: Abnormal   Collection Time: 03/21/17  3:18 PM  Result Value Ref Range   Albumin 4.7 3.5 - 5.5 g/dL   Bilirubin Total 0.9 0.0 - 1.2 mg/dL   Bilirubin, Direct 1.61 (H) 0.00 - 0.40 mg/dL   Alkaline Phosphatase 203 (H) 39 - 117 IU/L   AST 301 (H) 0 - 40 IU/L   ALT 867 (HH) 0 - 44 IU/L    Comment: Results confirmed on dilution.   Lipase     Status: None   Collection Time: 03/21/17  3:18 PM  Result Value Ref Range   Lipase 23 13 - 78 U/L  CBC with Differential/Platelet      Status: None   Collection Time: 03/21/17  3:18 PM  Result Value Ref Range   WBC 5.9 3.4 - 10.8 x10E3/uL   RBC 5.11 4.14 - 5.80 x10E6/uL   Hemoglobin 15.6 13.0 - 17.7 g/dL   Hematocrit 09.6 04.5 - 51.0 %   MCV 88 79 - 97 fL   MCH 30.5 26.6 - 33.0 pg   MCHC 34.7 31.5 - 35.7 g/dL   RDW 40.9 81.1 - 91.4 %   Platelets 209 150 - 379 x10E3/uL   Neutrophils 46 Not Estab. %   Lymphs 39 Not Estab. %   Monocytes 10 Not Estab. %   Eos 4 Not Estab. %   Basos 1 Not Estab. %   Neutrophils Absolute 2.7 1.4 - 7.0 x10E3/uL   Lymphocytes Absolute 2.3 0.7 - 3.1 x10E3/uL   Monocytes Absolute 0.6 0.1 - 0.9 x10E3/uL   EOS (ABSOLUTE) 0.2 0.0 - 0.4 x10E3/uL   Basophils Absolute 0.0 0.0 - 0.2 x10E3/uL   Immature Granulocytes 0 Not Estab. %   Immature Grans (Abs) 0.0 0.0 - 0.1 x10E3/uL   No results found.  Gallstones on U/S 1.5 years ago  Assessment/Plan Elevated LFT. 2 days ago. This patient has likely passed a gallstone. His pain is resolved. I would like to recheck liver function tests and depending on those results he may require more urgent surgery but with his pain being gone we can talk about elective surgery if his LFTs are back to normal. Otherwise he may require appear preop ERCP.  I will recheck CBC CMP and lipase today and call him with those results. If they are normal we would proceed with elective surgery if they are persistently elevated we will call him and have him admitted to the hospital for more urgent surgery. At this point his pain is gone and I suspect he is stones have passed but a retained stone is not out of the question.  I discussed with he and his family the options and the rationale for offering surgery and this approach. We discussed the difference between elective surgery and urgent surgery and the risk of bleeding infection conversion to an open procedure bile duct damage bile duct leak or retained common bile duct stone any of which could require further surgery he had a few  questions which were answered to the best of my ability and agreed to proceed.  Lattie Haw, MD, FACS

## 2017-03-24 ENCOUNTER — Telehealth: Payer: Self-pay | Admitting: Surgery

## 2017-03-24 NOTE — Telephone Encounter (Signed)
Pt advised of pre op date/time and sx date. Sx: 04/05/17 with Dr Ludwig Clarksooper--Laparoscopic cholecystectomy with gram.  Pre op: 03/29/17 between 1-5:00pm--Phone.   Patient made aware to call [phone], between 1-3:00pm the day before surgery, to find out what time to arrive.

## 2017-03-29 ENCOUNTER — Encounter
Admission: RE | Admit: 2017-03-29 | Discharge: 2017-03-29 | Disposition: A | Discharge status: Home / Self Care | Payer: 59 | Source: Ambulatory Visit | Attending: Surgery | Admitting: Surgery

## 2017-03-29 DIAGNOSIS — G4730 Sleep apnea, unspecified: Secondary | ICD-10-CM | POA: Diagnosis not present

## 2017-03-29 DIAGNOSIS — Z01818 Encounter for other preprocedural examination: Secondary | ICD-10-CM | POA: Insufficient documentation

## 2017-03-29 NOTE — Patient Instructions (Signed)
Your procedure is scheduled on: 04/05/17 Tues Report to Same Day Surgery 2nd floor medical mall [facility] Entrance-take elevator on left to 2nd floor.  Check in with surgery information desk.) To find out your arrival time please call [phone] between 1PM - 3PM on 04/04/17 Mon  Remember: Instructions that are not followed completely may result in serious medical risk, up to and including death, or upon the discretion of your surgeon and anesthesiologist your surgery may need to be rescheduled.    _x___ 1. Do not eat food or drink liquids after midnight. No gum chewing or                              hard candies.     __x__ 2. No Alcohol for 24 hours before or after surgery.   __x__3. No Smoking for 24 prior to surgery.   ____  4. Bring all medications with you on the day of surgery if instructed.    __x__ 5. Notify your doctor if there is any change in your medical condition     (cold, fever, infections).     Do not wear jewelry, make-up, hairpins, clips or nail polish.  Do not wear lotions, powders, or perfumes. You may wear deodorant.  Do not shave 48 hours prior to surgery. Men may shave face and neck.  Do not bring valuables to the hospital.    [facility] is not responsible for any belongings or valuables.               Contacts, dentures or bridgework may not be worn into surgery.  Leave your suitcase in the car. After surgery it may be brought to your room.  For patients admitted to the hospital, discharge time is determined by your                       treatment team.   Patients discharged the day of surgery will not be allowed to drive home.  You will need someone to drive you home and stay with you the night of your procedure.    Please read over the following fact sheets that you were given:   [facility] Preparing for Surgery and or MRSA Information   _x___ Take anti-hypertensive (unless it includes a diuretic), cardiac, seizure, asthma,     anti-reflux and  psychiatric medicines. These include:  1. fluvoxaMINE (LUVOX) 100 MG tablet  2.  3.  4.  5.  6.  ____Fleets enema or Magnesium Citrate as directed.   _x___ Use CHG Soap or sage wipes as directed on instruction sheet   ____ Use inhalers on the day of surgery and bring to hospital day of surgery  ____ Stop Metformin and Janumet 2 days prior to surgery.    ____ Take 1/2 of usual insulin dose the night before surgery and none on the morning     surgery.   _x___ Follow recommendations from Cardiologist, Pulmonologist or PCP regarding          stopping Aspirin, Coumadin, Pllavix ,Eliquis, Effient, or Pradaxa, and Pletal.  X____Stop Anti-inflammatories such as Advil, Aleve, Ibuprofen, Motrin, Naproxen, Naprosyn, Goodies powders or aspirin products. OK to take Tylenol and                          Celebrex.   _x___ Stop supplements until after surgery. Stop fish oils 1  week before surgery But may continue Vitamin D, Vitamin B,       and multivitamin.   ____ Bring C-Pap to the hospital.

## 2017-04-04 ENCOUNTER — Encounter
Admission: RE | Admit: 2017-04-04 | Discharge: 2017-04-04 | Disposition: A | Discharge status: Home / Self Care | Payer: 59 | Source: Ambulatory Visit | Attending: Surgery | Admitting: Surgery

## 2017-04-04 DIAGNOSIS — F419 Anxiety disorder, unspecified: Secondary | ICD-10-CM | POA: Diagnosis not present

## 2017-04-04 DIAGNOSIS — K8050 Calculus of bile duct without cholangitis or cholecystitis without obstruction: Secondary | ICD-10-CM | POA: Diagnosis present

## 2017-04-04 DIAGNOSIS — F329 Major depressive disorder, single episode, unspecified: Secondary | ICD-10-CM | POA: Diagnosis not present

## 2017-04-04 DIAGNOSIS — G4730 Sleep apnea, unspecified: Secondary | ICD-10-CM | POA: Diagnosis not present

## 2017-04-04 LAB — COMPREHENSIVE METABOLIC PANEL
ALBUMIN: 4.2 g/dL (ref 3.5–5.0)
ALK PHOS: 84 U/L (ref 38–126)
ALT: 50 U/L (ref 17–63)
AST: 26 U/L (ref 15–41)
Anion gap: 9 (ref 5–15)
BILIRUBIN TOTAL: 0.8 mg/dL (ref 0.3–1.2)
BUN: 14 mg/dL (ref 6–20)
CALCIUM: 9.1 mg/dL (ref 8.9–10.3)
CO2: 28 mmol/L (ref 22–32)
CREATININE: 1.05 mg/dL (ref 0.61–1.24)
Chloride: 103 mmol/L (ref 101–111)
GFR calc Af Amer: >60 mL/min (ref >60)
GFR calc non Af Amer: >60 mL/min (ref >60)
Glucose, Bld: 92 mg/dL (ref 65–99)
Potassium: 3.8 mmol/L (ref 3.5–5.1)
SODIUM: 140 mmol/L (ref 135–145)
Total Protein: 7.3 g/dL (ref 6.5–8.1)

## 2017-04-04 LAB — CBC WITH DIFFERENTIAL/PLATELET
BASOS PCT: 0 %
Basophils Absolute: 0 10*3/uL (ref 0–0.1)
EOS ABS: 0.2 10*3/uL (ref 0–0.7)
Eosinophils Relative: 3 %
HEMATOCRIT: 42.9 % (ref 40.0–52.0)
HEMOGLOBIN: 14.1 g/dL (ref 13.0–18.0)
LYMPHS ABS: 2.6 10*3/uL (ref 1.0–3.6)
Lymphocytes Relative: 38 %
MCH: 29.9 pg (ref 26.0–34.0)
MCHC: 32.9 g/dL (ref 32.0–36.0)
MCV: 90.9 fL (ref 80.0–100.0)
Monocytes Absolute: 0.6 10*3/uL (ref 0.2–1.0)
Monocytes Relative: 8 %
NEUTROS ABS: 3.4 10*3/uL (ref 1.4–6.5)
NEUTROS PCT: 51 %
Platelets: 178 10*3/uL (ref 150–440)
RBC: 4.72 MIL/uL (ref 4.40–5.90)
RDW: 14.1 % (ref 11.5–14.5)
WBC: 6.7 10*3/uL (ref 3.8–10.6)

## 2017-04-05 ENCOUNTER — Ambulatory Visit: Payer: 59 | Admitting: Certified Registered"

## 2017-04-05 ENCOUNTER — Encounter
Admission: RE | Disposition: A | Discharge status: Home / Self Care | Payer: Self-pay | Source: Ambulatory Visit | Attending: Surgery

## 2017-04-05 ENCOUNTER — Encounter: Payer: Self-pay | Admitting: *Deleted

## 2017-04-05 ENCOUNTER — Ambulatory Visit
Admission: RE | Admit: 2017-04-05 | Discharge: 2017-04-05 | Disposition: A | Discharge status: Home / Self Care | Payer: 59 | Source: Ambulatory Visit | Attending: Surgery | Admitting: Surgery

## 2017-04-05 DIAGNOSIS — K8050 Calculus of bile duct without cholangitis or cholecystitis without obstruction: Secondary | ICD-10-CM | POA: Diagnosis not present

## 2017-04-05 DIAGNOSIS — F329 Major depressive disorder, single episode, unspecified: Secondary | ICD-10-CM | POA: Insufficient documentation

## 2017-04-05 DIAGNOSIS — G4730 Sleep apnea, unspecified: Secondary | ICD-10-CM | POA: Insufficient documentation

## 2017-04-05 DIAGNOSIS — F419 Anxiety disorder, unspecified: Secondary | ICD-10-CM | POA: Insufficient documentation

## 2017-04-05 HISTORY — PX: CHOLECYSTECTOMY: SHX55

## 2017-04-05 HISTORY — DX: Personal history of urinary calculi: Z87.442

## 2017-04-05 LAB — GLUCOSE, CAPILLARY: GLUCOSE-CAPILLARY: 121 mg/dL — AB (ref 65–99)

## 2017-04-05 SURGERY — LAPAROSCOPIC CHOLECYSTECTOMY WITH INTRAOPERATIVE CHOLANGIOGRAM
Anesthesia: General | Wound class: Clean Contaminated

## 2017-04-05 MED ORDER — FENTANYL CITRATE (PF) 100 MCG/2ML IJ SOLN
INTRAMUSCULAR | Status: AC
  Filled 2017-04-05: qty 2

## 2017-04-05 MED ORDER — BUPIVACAINE-EPINEPHRINE (PF) 0.25% -1:200000 IJ SOLN
INTRAMUSCULAR | Status: DC | PRN
  Administered 2017-04-05: 30 mL via PERINEURAL

## 2017-04-05 MED ORDER — ACETAMINOPHEN 10 MG/ML IV SOLN
INTRAVENOUS | Status: DC | PRN
  Administered 2017-04-05: 1000 mg via INTRAVENOUS

## 2017-04-05 MED ORDER — LABETALOL HCL 5 MG/ML IV SOLN
INTRAVENOUS | Status: DC | PRN
  Administered 2017-04-05 (×2): 5 mg via INTRAVENOUS

## 2017-04-05 MED ORDER — ONDANSETRON HCL 4 MG/2ML IJ SOLN
INTRAMUSCULAR | Status: DC | PRN
  Administered 2017-04-05: 4 mg via INTRAVENOUS

## 2017-04-05 MED ORDER — HEPARIN SODIUM (PORCINE) 5000 UNIT/ML IJ SOLN
INTRAMUSCULAR | Status: AC
  Filled 2017-04-05: qty 1

## 2017-04-05 MED ORDER — SUGAMMADEX SODIUM 200 MG/2ML IV SOLN
INTRAVENOUS | Status: DC | PRN
  Administered 2017-04-05: 250 mg via INTRAVENOUS

## 2017-04-05 MED ORDER — FAMOTIDINE 20 MG PO TABS
20.0000 mg | ORAL_TABLET | Freq: Once | ORAL | Status: AC
  Administered 2017-04-05: 20 mg via ORAL

## 2017-04-05 MED ORDER — LABETALOL HCL 5 MG/ML IV SOLN
INTRAVENOUS | Status: AC
  Filled 2017-04-05: qty 4

## 2017-04-05 MED ORDER — FENTANYL CITRATE (PF) 100 MCG/2ML IJ SOLN
25.0000 ug | INTRAMUSCULAR | Status: DC | PRN
  Administered 2017-04-05 (×2): 25 ug via INTRAVENOUS

## 2017-04-05 MED ORDER — ONDANSETRON HCL 4 MG/2ML IJ SOLN: 4.0000 mg | Freq: Once | INTRAMUSCULAR | Status: DC | PRN

## 2017-04-05 MED ORDER — LACTATED RINGERS IV SOLN
INTRAVENOUS | Status: DC
  Administered 2017-04-05: 08:00:00 via INTRAVENOUS

## 2017-04-05 MED ORDER — FENTANYL CITRATE (PF) 100 MCG/2ML IJ SOLN
INTRAMUSCULAR | Status: DC | PRN
  Administered 2017-04-05: 50 ug via INTRAVENOUS
  Administered 2017-04-05: 100 ug via INTRAVENOUS

## 2017-04-05 MED ORDER — LIDOCAINE HCL (CARDIAC) 20 MG/ML IV SOLN
INTRAVENOUS | Status: DC | PRN
  Administered 2017-04-05: 100 mg via INTRAVENOUS

## 2017-04-05 MED ORDER — HYDROCODONE-ACETAMINOPHEN 5-325 MG PO TABS
1.0000 | ORAL_TABLET | ORAL | Status: DC | PRN
  Administered 2017-04-05: 1 via ORAL

## 2017-04-05 MED ORDER — CHLORHEXIDINE GLUCONATE CLOTH 2 % EX PADS: 6.0000 | MEDICATED_PAD | Freq: Once | CUTANEOUS | Status: DC

## 2017-04-05 MED ORDER — SUGAMMADEX SODIUM 500 MG/5ML IV SOLN
INTRAVENOUS | Status: AC
  Filled 2017-04-05: qty 5

## 2017-04-05 MED ORDER — DEXAMETHASONE SODIUM PHOSPHATE 10 MG/ML IJ SOLN
INTRAMUSCULAR | Status: AC
  Filled 2017-04-05: qty 1

## 2017-04-05 MED ORDER — SUCCINYLCHOLINE CHLORIDE 20 MG/ML IJ SOLN
INTRAMUSCULAR | Status: DC | PRN
  Administered 2017-04-05: 100 mg via INTRAVENOUS

## 2017-04-05 MED ORDER — BUPIVACAINE-EPINEPHRINE (PF) 0.25% -1:200000 IJ SOLN
INTRAMUSCULAR | Status: AC
  Filled 2017-04-05: qty 30

## 2017-04-05 MED ORDER — PROPOFOL 10 MG/ML IV BOLUS
INTRAVENOUS | Status: DC | PRN
  Administered 2017-04-05: 200 mg via INTRAVENOUS

## 2017-04-05 MED ORDER — DEXAMETHASONE SODIUM PHOSPHATE 10 MG/ML IJ SOLN
INTRAMUSCULAR | Status: DC | PRN
  Administered 2017-04-05: 4 mg via INTRAVENOUS

## 2017-04-05 MED ORDER — HEPARIN SODIUM (PORCINE) 5000 UNIT/ML IJ SOLN
5000.0000 [IU] | Freq: Once | INTRAMUSCULAR | Status: AC
  Administered 2017-04-05: 5000 [IU] via SUBCUTANEOUS

## 2017-04-05 MED ORDER — HYDROCODONE-ACETAMINOPHEN 5-300 MG PO TABS: 1.0000 | ORAL_TABLET | ORAL | 0 refills | Status: DC | PRN

## 2017-04-05 MED ORDER — ONDANSETRON HCL 4 MG/2ML IJ SOLN
INTRAMUSCULAR | Status: AC
  Filled 2017-04-05: qty 2

## 2017-04-05 MED ORDER — ROCURONIUM BROMIDE 50 MG/5ML IV SOLN
INTRAVENOUS | Status: AC
  Filled 2017-04-05: qty 1

## 2017-04-05 MED ORDER — ACETAMINOPHEN 10 MG/ML IV SOLN
INTRAVENOUS | Status: AC
  Filled 2017-04-05: qty 100

## 2017-04-05 MED ORDER — FAMOTIDINE 20 MG PO TABS
ORAL_TABLET | ORAL | Status: AC
  Filled 2017-04-05: qty 1

## 2017-04-05 MED ORDER — CEFAZOLIN SODIUM-DEXTROSE 2-4 GM/100ML-% IV SOLN
2.0000 g | INTRAVENOUS | Status: AC
  Administered 2017-04-05: 2 g via INTRAVENOUS

## 2017-04-05 MED ORDER — ROCURONIUM BROMIDE 100 MG/10ML IV SOLN
INTRAVENOUS | Status: DC | PRN
  Administered 2017-04-05: 5 mg via INTRAVENOUS
  Administered 2017-04-05: 45 mg via INTRAVENOUS

## 2017-04-05 MED ORDER — MIDAZOLAM HCL 2 MG/2ML IJ SOLN
INTRAMUSCULAR | Status: DC | PRN
  Administered 2017-04-05: 2 mg via INTRAVENOUS

## 2017-04-05 MED ORDER — MIDAZOLAM HCL 2 MG/2ML IJ SOLN
INTRAMUSCULAR | Status: AC
  Filled 2017-04-05: qty 2

## 2017-04-05 MED ORDER — CEFAZOLIN SODIUM-DEXTROSE 2-4 GM/100ML-% IV SOLN
INTRAVENOUS | Status: AC
  Filled 2017-04-05: qty 100

## 2017-04-05 MED ORDER — LIDOCAINE HCL (PF) 2 % IJ SOLN
INTRAMUSCULAR | Status: AC
  Filled 2017-04-05: qty 2

## 2017-04-05 MED ORDER — HYDROCODONE-ACETAMINOPHEN 5-325 MG PO TABS
ORAL_TABLET | ORAL | Status: AC
  Filled 2017-04-05: qty 1

## 2017-04-05 MED ORDER — PROPOFOL 10 MG/ML IV BOLUS
INTRAVENOUS | Status: AC
  Filled 2017-04-05: qty 20

## 2017-04-05 SURGICAL SUPPLY — 40 items
ADHESIVE MASTISOL STRL (MISCELLANEOUS) ×3 IMPLANT
APPLIER CLIP ROT 10 11.4 M/L (STAPLE) ×3
BLADE SURG SZ11 CARB STEEL (BLADE) ×3 IMPLANT
CANISTER SUCT 1200ML W/VALVE (MISCELLANEOUS) ×3 IMPLANT
CHLORAPREP W/TINT 26ML (MISCELLANEOUS) ×3 IMPLANT
CLIP APPLIE ROT 10 11.4 M/L (STAPLE) ×1 IMPLANT
CLOSURE WOUND 1/2 X4 (GAUZE/BANDAGES/DRESSINGS) ×1
ELECT REM PT RETURN 9FT ADLT (ELECTROSURGICAL) ×3
ELECTRODE REM PT RTRN 9FT ADLT (ELECTROSURGICAL) ×1 IMPLANT
GAUZE SPONGE NON-WVN 2X2 STRL (MISCELLANEOUS) ×4 IMPLANT
GLOVE BIO SURGEON STRL SZ8 (GLOVE) ×3 IMPLANT
GOWN STRL REUS W/ TWL LRG LVL3 (GOWN DISPOSABLE) ×4 IMPLANT
GOWN STRL REUS W/TWL LRG LVL3 (GOWN DISPOSABLE) ×8
IRRIGATION STRYKERFLOW (MISCELLANEOUS) ×1 IMPLANT
IRRIGATOR STRYKERFLOW (MISCELLANEOUS) ×3
IV CATH ANGIO 12GX3 LT BLUE (NEEDLE) ×3 IMPLANT
IV NS 1000ML (IV SOLUTION) ×2
IV NS 1000ML BAXH (IV SOLUTION) ×1 IMPLANT
JACKSON PRATT 10 (INSTRUMENTS) IMPLANT
KIT RM TURNOVER STRD PROC AR (KITS) ×3 IMPLANT
LABEL OR SOLS (LABEL) ×3 IMPLANT
NDL SAFETY 22GX1.5 (NEEDLE) ×3 IMPLANT
NEEDLE VERESS 14GA 120MM (NEEDLE) ×3 IMPLANT
NS IRRIG 500ML POUR BTL (IV SOLUTION) ×3 IMPLANT
PACK LAP CHOLECYSTECTOMY (MISCELLANEOUS) ×3 IMPLANT
POUCH SPECIMEN RETRIEVAL 10MM (ENDOMECHANICALS) ×3 IMPLANT
SCISSORS METZENBAUM CVD 33 (INSTRUMENTS) ×3 IMPLANT
SLEEVE ENDOPATH XCEL 5M (ENDOMECHANICALS) ×6 IMPLANT
SPONGE LAP 18X18 5 PK (GAUZE/BANDAGES/DRESSINGS) ×3 IMPLANT
SPONGE VERSALON 2X2 STRL (MISCELLANEOUS) ×8
SPONGE VERSALON 4X4 4PLY (MISCELLANEOUS) IMPLANT
STRIP CLOSURE SKIN 1/2X4 (GAUZE/BANDAGES/DRESSINGS) ×2 IMPLANT
SUT MNCRL 4-0 (SUTURE) ×2
SUT MNCRL 4-0 27XMFL (SUTURE) ×1
SUT VICRYL 0 AB UR-6 (SUTURE) ×3 IMPLANT
SUTURE MNCRL 4-0 27XMF (SUTURE) ×1 IMPLANT
SYR 20CC LL (SYRINGE) ×3 IMPLANT
TROCAR XCEL NON-BLD 11X100MML (ENDOMECHANICALS) ×3 IMPLANT
TROCAR XCEL NON-BLD 5MMX100MML (ENDOMECHANICALS) ×3 IMPLANT
TUBING INSUFFLATOR HI FLOW (MISCELLANEOUS) ×3 IMPLANT

## 2017-04-05 NOTE — Anesthesia Procedure Notes (Signed)
Procedure Name: Intubation Performed by: Lance Muss Pre-anesthesia Checklist: Patient identified, Patient being monitored, Timeout performed, Emergency Drugs available and Suction available Patient Re-evaluated:Patient Re-evaluated prior to induction Oxygen Delivery Method: Circle system utilized Preoxygenation: Pre-oxygenation with 100% oxygen Induction Type: IV induction Ventilation: Mask ventilation without difficulty Laryngoscope Size: Mac and 3 Grade View: Grade III Tube type: Oral Tube size: 7.5 mm Number of attempts: 1 Airway Equipment and Method: Stylet Placement Confirmation: ETT inserted through vocal cords under direct vision,  positive ETCO2 and breath sounds checked- equal and bilateral Secured at: 21 cm Tube secured with: Tape Dental Injury: Teeth and Oropharynx as per pre-operative assessment  Difficulty Due To: Difficult Airway- due to anterior larynx Future Recommendations: Recommend- induction with short-acting agent, and alternative techniques readily available

## 2017-04-05 NOTE — Anesthesia Postprocedure Evaluation (Signed)
Anesthesia Post Note  Patient: GALDINO TWISDALE  Procedure(s) Performed: Procedure(s) (LRB): LAPAROSCOPIC CHOLECYSTECTOMY WITH INTRAOPERATIVE CHOLANGIOGRAM (N/A)  Patient location during evaluation: PACU Anesthesia Type: General Level of consciousness: awake and alert and oriented Pain management: pain level controlled Vital Signs Assessment: post-procedure vital signs reviewed and stable Respiratory status: spontaneous breathing Cardiovascular status: blood pressure returned to baseline Anesthetic complications: no     Last Vitals:  Vitals:   04/05/17 1053 04/05/17 1136  BP: 125/77 114/68  Pulse: 67 75  Resp: 16 16  Temp: 36.7 C   SpO2: 95% 96%    Last Pain:  Vitals:   04/05/17 1136  TempSrc:   PainSc: 2                  Chawn Spraggins

## 2017-04-05 NOTE — Progress Notes (Signed)
Preoperative Review   Patient is met in the preoperative holding area. The history is reviewed in the chart and with the patient. I personally reviewed the options and rationale as well as the risks of this procedure that have been previously discussed with the patient. All questions asked by the patient and/or family were answered to their satisfaction. No need for cholangiogram as LFT are nml on two recent lab draws.  Patient agrees to proceed with this procedure at this time.  Florene Glen M.D. FACS

## 2017-04-05 NOTE — Transfer of Care (Signed)
Immediate Anesthesia Transfer of Care Note  Patient: Richard Frost  Procedure(s) Performed: Procedure(s): LAPAROSCOPIC CHOLECYSTECTOMY WITH INTRAOPERATIVE CHOLANGIOGRAM (N/A)  Patient Location: PACU  Anesthesia Type:General  Level of Consciousness: awake and responds to stimulation  Airway & Oxygen Therapy: Patient Spontanous Breathing and Patient connected to face mask oxygen  Post-op Assessment: Report given to RN and Post -op Vital signs reviewed and stable  Post vital signs: Reviewed and stable  Last Vitals:  Vitals:   04/05/17 0805 04/05/17 0957  BP: (!) 141/91 133/84  Pulse: (!) 107 87  Resp: 16 16  Temp: (!) 36.3 C   SpO2: 96% 97%    Last Pain:  Vitals:   04/05/17 0805  TempSrc: Tympanic         Complications: No apparent anesthesia complications

## 2017-04-05 NOTE — Anesthesia Preprocedure Evaluation (Addendum)
Anesthesia Evaluation  Patient identified by MRN, date of birth, ID band Patient awake    Reviewed: Allergy & Precautions, NPO status , Patient's Chart, lab work & pertinent test results  Airway Mallampati: III  TM Distance: >3 FB     Dental  (+) Teeth Intact   Pulmonary sleep apnea ,    Pulmonary exam normal        Cardiovascular negative cardio ROS Normal cardiovascular exam     Neuro/Psych PSYCHIATRIC DISORDERS Anxiety Depression    GI/Hepatic negative GI ROS, Neg liver ROS,   Endo/Other  negative endocrine ROS  Renal/GU   negative genitourinary   Musculoskeletal negative musculoskeletal ROS (+)   Abdominal Normal abdominal exam  (+)   Peds negative pediatric ROS (+)  Hematology negative hematology ROS (+)   Anesthesia Other Findings   Reproductive/Obstetrics                            Anesthesia Physical Anesthesia Plan  ASA: II  Anesthesia Plan: General   Post-op Pain Management:    Induction: Intravenous  PONV Risk Score and Plan: 3 and Ondansetron, Dexamethasone, Midazolam and Propofol infusion  Airway Management Planned: Oral ETT  Additional Equipment:   Intra-op Plan:   Post-operative Plan: Extubation in OR  Informed Consent: I have reviewed the patients History and Physical, chart, labs and discussed the procedure including the risks, benefits and alternatives for the proposed anesthesia with the patient or authorized representative who has indicated his/her understanding and acceptance.   Dental advisory given  Plan Discussed with: CRNA and Surgeon  Anesthesia Plan Comments:         Anesthesia Quick Evaluation

## 2017-04-05 NOTE — Op Note (Signed)
Laparoscopic Cholecystectomy  Pre-operative Diagnosis: Biliary colic  Post-operative Diagnosis: Biliary colic  Procedure: Laparoscopic cholecystectomy  Surgeon: Adah Salvage. Excell Seltzer, MD FACS  Anesthesia: Gen. with endotracheal tube  Assistant: Surgical tech  Procedure Details  The patient was seen again in the Holding Room. The benefits, complications, treatment options, and expected outcomes were discussed with the patient. The risks of bleeding, infection, recurrence of symptoms, failure to resolve symptoms, bile duct damage, bile duct leak, retained common bile duct stone, bowel injury, any of which could require further surgery and/or ERCP, stent, or papillotomy were reviewed with the patient. The likelihood of improving the patient's symptoms with return to their baseline status is good.  The patient's initial liver function tests had been elevated but to repeat tests were found to be normal suggesting no stone in the bile duct. Therefore no cholangiogram will be performed. This was discussed with the patient. The patient and/or family concurred with the proposed plan, giving informed consent.  The patient was taken to Operating Room, identified as Richard Frost and the procedure verified as Laparoscopic Cholecystectomy.  A Time Out was held and the above information confirmed.  Prior to the induction of general anesthesia, antibiotic prophylaxis was administered. VTE prophylaxis was in place. General endotracheal anesthesia was then administered and tolerated well. After the induction, the abdomen was prepped with Chloraprep and draped in the sterile fashion. The patient was positioned in the supine position.  Local anesthetic  was injected into the skin near the umbilicus and an incision made. The Veress needle was placed. Pneumoperitoneum was then created with CO2 and tolerated well without any adverse changes in the patient's vital signs. A 73mm port was placed in the periumbilical  position and the abdominal cavity was explored.  Two 5-mm ports were placed in the right upper quadrant and a 12 mm epigastric port was placed all under direct vision. All skin incisions  were infiltrated with a local anesthetic agent before making the incision and placing the trocars.   The patient was positioned  in reverse Trendelenburg, tilted slightly to the patient's left.  The gallbladder was identified, the fundus grasped and retracted cephalad. Extensive Adhesions were lysed bluntly. These adhesions were mostly from omentum to the liver encasing a good portion of the gallbladder. The infundibulum was grasped and retracted laterally, exposing the peritoneum overlying the triangle of Calot. This was then divided and exposed in a blunt fashion. A critical view of the cystic duct and cystic artery was obtained.  The cystic duct was clearly identified and bluntly dissected.   The cystic duct was doubly clipped and divided as was the cystic artery. A second branch of the cystic artery was doubly clipped and divided close to the gallbladder.  The gallbladder was taken from the gallbladder fossa in a retrograde fashion with the electrocautery. The gallbladder was removed and placed in an Endocatch bag. The liver bed was irrigated and inspected. Hemostasis was achieved with the electrocautery. Copious irrigation was utilized and was repeatedly aspirated until clear.  The gallbladder and Endocatch sac were then removed through the epigastric port site.   Inspection of the right upper quadrant was performed. No bleeding, bile duct injury or leak, or bowel injury was noted. Pneumoperitoneum was released.  The epigastric port site was closed with figure-of-eight 0 Vicryl sutures. 4-0 subcuticular Monocryl was used to close the skin. Steristrips and Mastisol and sterile dressings were  applied.  The patient was then extubated and brought to the recovery room in  stable condition. Sponge, lap, and needle counts  were correct at closure and at the conclusion of the case.   Findings: Chronic Cholecystitis with extensive adhesions to the gallbladder and liver  Estimated Blood Loss: Minimal         Drains: None         Specimens: Gallbladder           Complications: none               Richard Frost E. Excell Seltzer, MD, FACS

## 2017-04-05 NOTE — Discharge Instructions (Signed)
Remove dressing in 24 hours. °May shower in 24 hours. °Leave paper strips in place. °Resume all home medications. °Follow-up with Dr. Cooper in 10 days. ° °AMBULATORY SURGERY  °DISCHARGE INSTRUCTIONS ° ° °1) The drugs that you were given will stay in your system until tomorrow so for the next 24 hours you should not: ° °A) Drive an automobile °B) Make any legal decisions °C) Drink any alcoholic beverage ° ° °2) You may resume regular meals tomorrow.  Today it is better to start with liquids and gradually work up to solid foods. ° °You may eat anything you prefer, but it is better to start with liquids, then soup and crackers, and gradually work up to solid foods. ° ° °3) Please notify your doctor immediately if you have any unusual bleeding, trouble breathing, redness and pain at the surgery site, drainage, fever, or pain not relieved by medication. ° ° ° °4) Additional Instructions: ° ° ° ° ° ° ° °Please contact your physician with any problems or Same Day Surgery at [phone], Monday through Friday 6 am to 4 pm, or [facility] [place] Main number at [phone]. °

## 2017-04-05 NOTE — Anesthesia Post-op Follow-up Note (Signed)
Anesthesia QCDR form completed.        

## 2017-04-06 ENCOUNTER — Telehealth: Payer: Self-pay

## 2017-04-06 ENCOUNTER — Telehealth: Payer: Self-pay | Admitting: Surgery

## 2017-04-06 ENCOUNTER — Encounter: Payer: Self-pay | Admitting: Surgery

## 2017-04-06 NOTE — Telephone Encounter (Signed)
Post-op call made to patient at this time. Spoke with patient . Post-op interview questions below.  1. How are you feeling? good  2. Is your pain controlled? yes  3. What are you doing for the pain? Taking medicine  4. Are you having any Nausea or Vomiting? No   5. Are you having any Fever or Chills? No  6. Are you having any Constipation or Diarrhea? constipation  7. Is there any Swelling or Bruising you are concerned about? no  8. Do you have any questions or concerns at this time? no   Discussion: Patient reminded of follow up appointment.

## 2017-04-06 NOTE — Telephone Encounter (Signed)
Patient had surgery yesterday but is scheduled to return to work tomorrow. He would like a note to work from home emailed to him at [email or web address]

## 2017-04-06 NOTE — Telephone Encounter (Signed)
Returned phone call to patient at this time.  Patient is requesting to work from home until 04/18/17. Then return to regular work with continued lifting restrictions for remainder of post-op period. Patient understands that he may not lift more than 15 lbs.  I explained that I could not email this but he would like to activate his my chart account and have this letter sent to that portal. Letter has been written and will be sent to MyChart account as soon as it is activated.

## 2017-04-07 LAB — SURGICAL PATHOLOGY

## 2017-04-15 ENCOUNTER — Encounter: Payer: Self-pay | Admitting: Surgery

## 2017-04-15 ENCOUNTER — Ambulatory Visit (INDEPENDENT_AMBULATORY_CARE_PROVIDER_SITE_OTHER): Payer: 59 | Admitting: Surgery

## 2017-04-15 VITALS — BP 123/80 | HR 88 | Temp 98.1°F | Ht 70.5 in | Wt 262.0 lb

## 2017-04-15 DIAGNOSIS — K8040 Calculus of bile duct with cholecystitis, unspecified, without obstruction: Secondary | ICD-10-CM

## 2017-04-15 NOTE — Patient Instructions (Signed)
GENERAL POST-OPERATIVE PATIENT INSTRUCTIONS   WOUND CARE INSTRUCTIONS:  Keep a dry clean dressing on the wound if there is drainage. The initial bandage may be removed after 24 hours.  Once the wound has quit draining you may leave it open to air.  If clothing rubs against the wound or causes irritation and the wound is not draining you may cover it with a dry dressing during the daytime.  Try to keep the wound dry and avoid ointments on the wound unless directed to do so.  If the wound becomes bright red and painful or starts to drain infected material that is not clear, please contact your physician immediately.  If the wound is mildly pink and has a thick firm ridge underneath it, this is normal, and is referred to as a healing ridge.  This will resolve over the next 4-6 weeks.  BATHING: You may shower if you have been informed of this by your surgeon. However, Please do not submerge in a tub, hot tub, or pool until incisions are completely sealed or have been told by your surgeon that you may do so.  DIET:  You may eat any foods that you can tolerate.  It is a good idea to eat a high fiber diet and take in plenty of fluids to prevent constipation.  If you do become constipated you may want to take a mild laxative or take ducolax tablets on a daily basis until your bowel habits are regular.  Constipation can be very uncomfortable, along with straining, after recent surgery.  ACTIVITY:  You are encouraged to cough and deep breath or use your incentive spirometer if you were given one, every 15-30 minutes when awake.  This will help prevent respiratory complications and low grade fevers post-operatively if you had a general anesthetic.  You may want to hug a pillow when coughing and sneezing to add additional support to the surgical area, if you had abdominal or chest surgery, which will decrease pain during these times.  You are encouraged to walk and engage in light activity for the next two weeks.  You  should not lift more than 20 pounds, until 05/17/2017 as it could put you at increased risk for complications.  Twenty pounds is roughly equivalent to a plastic bag of groceries. At that time- Listen to your body when lifting, if you have pain when lifting, stop and then try again in a few days. Soreness after doing exercises or activities of daily living is normal as you get back in to your normal routine.  MEDICATIONS:  Try to take narcotic medications and anti-inflammatory medications, such as tylenol, ibuprofen, naprosyn, etc., with food.  This will minimize stomach upset from the medication.  Should you develop nausea and vomiting from the pain medication, or develop a rash, please discontinue the medication and contact your physician.  You should not drive, make important decisions, or operate machinery when taking narcotic pain medication.  SUNBLOCK Use sun block to incision area over the next year if this area will be exposed to sun. This helps decrease scarring and will allow you avoid a permanent darkened area over your incision.  QUESTIONS:  Please feel free to call our office if you have any questions, and we will be glad to assist you. [phone]   

## 2017-04-15 NOTE — Progress Notes (Signed)
Outpatient postop visit  04/15/2017  Kerby Lessatrick S Bodiford is an 46 y.o. male.    Procedure: lap chole  ZO:[id number]:mild constipation  HPI: Patient without complaints he is eating her normal diet. No fevers or chills he had mild constipation which has resolved.  Medications reviewed.    Physical Exam:  BP 123/80   Pulse 88   Temp 98.1 F (36.7 C) (Oral)   Ht 5' 10.5" (1.791 m)   Wt 262 lb (118.8 kg)   BMI 37.06 kg/m     PE: Minimal ecchymosis which is remote resolving no erythema no drainage nontender abdomen no icterus no jaundice    Assessment/Plan:  Status post laparoscopic cholecystectomy pathology is checked and benign. No other problems he'll follow-up with us on an as-needed basis  Lattie Hawichard E Hildreth Orsak, MD, FACS

## 2018-03-03 ENCOUNTER — Ambulatory Visit (INDEPENDENT_AMBULATORY_CARE_PROVIDER_SITE_OTHER): Payer: 59 | Admitting: Family Medicine

## 2018-03-03 ENCOUNTER — Encounter: Payer: Self-pay | Admitting: Family Medicine

## 2018-03-03 VITALS — BP 113/77 | HR 87 | Resp 16 | Ht 70.5 in | Wt 273.0 lb

## 2018-03-03 DIAGNOSIS — E785 Hyperlipidemia, unspecified: Secondary | ICD-10-CM

## 2018-03-03 DIAGNOSIS — R7303 Prediabetes: Secondary | ICD-10-CM

## 2018-03-03 DIAGNOSIS — Z0000 Encounter for general adult medical examination without abnormal findings: Secondary | ICD-10-CM

## 2018-03-03 NOTE — Assessment & Plan Note (Signed)
Weight loss discussed and low calorie diet given.

## 2018-03-03 NOTE — Progress Notes (Signed)
Name: Richard Frost   MRN: [id number]    DOB: April 03, 1971   Date:03/03/2018       Progress Note  Subjective  Chief Complaint  Chief Complaint  Patient presents with  . Annual Exam    Patient is a 47 year old male who presents for a comprehensive physical exam. The patient reports the following problems: none. Health maintenance has been reviewed up to date.   Prediabetes Chronic Stable Will check a1c and renal panel.   Morbid (severe) obesity due to excess calories (HCC) Weight loss discussed and low calorie diet given.   Past Medical History:  Diagnosis Date  . Allergic rhinitis 08/08/2015  . Allergy   . Anxiety   . Biliary colic   . Depression   . Fatty liver disease, nonalcoholic 08/19/2015  . Gallstones 08/19/2015  . History of kidney stones   . Nephrolithiasis 08/08/2015  . Obesity, Class II, BMI 35-39.9, with comorbidity 08/08/2015  . OCD (obsessive compulsive disorder)   . OSA treated with BiPAP 08/08/2015  . Prediabetes 08/08/2015  . Sleep apnea    cpap  . Vitamin D deficiency 08/11/2015    Past Surgical History:  Procedure Laterality Date  . CHOLECYSTECTOMY N/A 04/05/2017   Procedure: LAPAROSCOPIC CHOLECYSTECTOMY WITH INTRAOPERATIVE CHOLANGIOGRAM;  Surgeon: Lattie Haw, MD;  Location: [facility] ORS;  Service: General;  Laterality: N/A;  . INTERNAL URETHROTOMY     remove kidney stone  . LITHOTRIPSY    . LITHOTRIPSY     x 7    Family History  Problem Relation Age of Onset  . Osteoarthritis Mother   . Hyperlipidemia Father   . Osteoarthritis Father   . Hypertension Father   . Hypertension Sister   . Cancer Maternal Aunt   . Diabetes Paternal Aunt   . Diabetes Paternal Grandmother     Social History   Socioeconomic History  . Marital status: Married    Spouse name: Not on file  . Number of children: Not on file  . Years of education: Not on file  . Highest education level: Not on file  Occupational History  . Not on file  Social Needs   . Financial resource strain: Not on file  . Food insecurity:    Worry: Not on file    Inability: Not on file  . Transportation needs:    Medical: Not on file    Non-medical: Not on file  Tobacco Use  . Smoking status: Never Smoker  . Smokeless tobacco: Never Used  Substance and Sexual Activity  . Alcohol use: No    Alcohol/week: 0.0 oz  . Drug use: No  . Sexual activity: Not Currently  Lifestyle  . Physical activity:    Days per week: Not on file    Minutes per session: Not on file  . Stress: Not on file  Relationships  . Social connections:    Talks on phone: Not on file    Gets together: Not on file    Attends religious service: Not on file    Active member of club or organization: Not on file    Attends meetings of clubs or organizations: Not on file    Relationship status: Not on file  . Intimate partner violence:    Fear of current or ex partner: Not on file    Emotionally abused: Not on file    Physically abused: Not on file    Forced sexual activity: Not on file  Other Topics Concern  . Not  on file  Social History Narrative   ** Merged History Encounter **        Allergies  Allergen Reactions  . Septra [Sulfamethoxazole-Trimethoprim] Hives  . Sulfa Antibiotics Hives    Outpatient Medications Prior to Visit  Medication Sig Dispense Refill  . ARIPiprazole (ABILIFY) 5 MG tablet Take 5 mg by mouth at bedtime. Dr Percell BeltMillet     . cetirizine (ZYRTEC) 10 MG tablet Take 10 mg by mouth at bedtime. otc    . clonazePAM (KLONOPIN) 2 MG tablet Take 1 tablet by mouth at bedtime. Dr Percell BeltMillet    . fluvoxaMINE (LUVOX) 100 MG tablet Take 100 mg by mouth 2 (two) times daily. Dr Percell BeltMillet    . Omega 3 1000 MG CAPS Take 1 capsule by mouth 2 (two) times daily.    . Cholecalciferol 5000 units capsule Take 1 capsule (5,000 Units total) by mouth daily. (Patient not taking: Reported on 03/03/2018) 90 capsule 3   No facility-administered medications prior to visit.     Review of Systems   Constitutional: Negative for chills, fever, malaise/fatigue and weight loss.  HENT: Negative for congestion, ear discharge, ear pain, sinus pain and sore throat.   Eyes: Negative for blurred vision.  Respiratory: Negative for cough, sputum production, shortness of breath and wheezing.   Cardiovascular: Negative for chest pain, palpitations and leg swelling.  Gastrointestinal: Positive for heartburn. Negative for abdominal pain, blood in stool, constipation, diarrhea, melena and nausea.  Genitourinary: Negative for dysuria, frequency, hematuria and urgency.  Musculoskeletal: Negative for back pain, joint pain, myalgias and neck pain.  Skin: Negative for rash.  Neurological: Negative for dizziness, tingling, sensory change, focal weakness and headaches.  Endo/Heme/Allergies: Negative for environmental allergies and polydipsia. Does not bruise/bleed easily.  Psychiatric/Behavioral: Positive for depression. Negative for suicidal ideas. The patient is not nervous/anxious and does not have insomnia.        Sees psychiatry     Objective  Vitals:   03/03/18 0826  BP: 113/77  Pulse: 87  Resp: 16  SpO2: 98%  Weight: 273 lb (123.8 kg)  Height: 5' 10.5" (1.791 m)    Physical Exam  Constitutional: He is oriented to person, place, and time. Vital signs are normal. He appears well-developed and well-nourished.  HENT:  Head: Normocephalic and atraumatic.  Right Ear: Hearing, tympanic membrane, external ear and ear canal normal.  Left Ear: Hearing, tympanic membrane, external ear and ear canal normal.  Nose: Nose normal. No mucosal edema.  Mouth/Throat: Uvula is midline and oropharynx is clear and moist.  Eyes: Pupils are equal, round, and reactive to light. Conjunctivae, EOM and lids are normal. Lids are everted and swept, no foreign bodies found. Right eye exhibits no discharge. Left eye exhibits no discharge. No scleral icterus.  Fundoscopic exam:      The right eye shows no arteriolar  narrowing and no AV nicking.       The left eye shows no arteriolar narrowing and no AV nicking.  Neck: Trachea normal and normal range of motion. Neck supple. Normal carotid pulses, no hepatojugular reflux and no JVD present. No tracheal tenderness present. Carotid bruit is not present. No tracheal deviation present. No thyroid mass and no thyromegaly present.  Cardiovascular: Normal rate, regular rhythm, S1 normal, S2 normal, normal heart sounds, intact distal pulses and normal pulses. PMI is not displaced. Exam reveals no gallop, no S3, no S4, no distant heart sounds and no friction rub.  No murmur heard.  No systolic murmur is  present.  No diastolic murmur is present. Pulses:      Carotid pulses are 2+ on the right side, and 2+ on the left side.      Radial pulses are 2+ on the right side, and 2+ on the left side.       Femoral pulses are 2+ on the right side, and 2+ on the left side.      Popliteal pulses are 2+ on the right side, and 2+ on the left side.       Dorsalis pedis pulses are 2+ on the right side, and 2+ on the left side.       Posterior tibial pulses are 2+ on the right side, and 2+ on the left side.  Pulmonary/Chest: Effort normal and breath sounds normal. No stridor. No respiratory distress. He has no decreased breath sounds. He has no wheezes. He has no rales. He exhibits no mass.  Abdominal: Soft. Normal appearance, normal aorta and bowel sounds are normal. He exhibits no ascites and no mass. There is no hepatosplenomegaly. There is no tenderness. There is no rebound, no guarding and no CVA tenderness. Hernia confirmed negative in the right inguinal area and confirmed negative in the left inguinal area.  Genitourinary: Testes normal and penis normal. Uncircumcised.  Musculoskeletal: Normal range of motion. He exhibits no edema or tenderness.  Feet:  Right Foot:  Skin Integrity: Negative for ulcer, blister, skin breakdown or erythema.  Left Foot:  Skin Integrity: Negative  for ulcer, blister, skin breakdown or erythema.  Lymphadenopathy:       Head (right side): No submental and no submandibular adenopathy present.       Head (left side): No submental and no submandibular adenopathy present.    He has no cervical adenopathy.    He has no axillary adenopathy. No inguinal adenopathy noted on the right or left side.  Neurological: He is alert and oriented to person, place, and time. He has normal strength and normal reflexes. No cranial nerve deficit or sensory deficit.  Reflex Scores:      Tricep reflexes are 2+ on the right side and 2+ on the left side.      Bicep reflexes are 2+ on the right side and 2+ on the left side.      Brachioradialis reflexes are 2+ on the right side and 2+ on the left side.      Patellar reflexes are 2+ on the right side and 2+ on the left side.      Achilles reflexes are 2+ on the right side and 2+ on the left side. Skin: Skin is warm, dry and intact. No rash noted. No pallor.  Psychiatric: He has a normal mood and affect. His speech is normal and behavior is normal. Cognition and memory are normal.  Nursing note and vitals reviewed. Gayle Collard is a 47 y.o. male who presents today for his Complete Annual Exam. He feels well. He reports exercising intermitant. He reports he is sleeping well.  Immunizations are reviewed and recommendations provided.   Age appropriate screening tests are discussed. Counseling given for risk factor reduction interventions. Health risks of being over weight were discussed and patient was counseled on weight loss options and exercise.  Assessment & Plan  Problem List Items Addressed This Visit      Other   Prediabetes    Chronic Stable Will check a1c and renal panel.       Relevant Orders   Hemoglobin A1c  Renal Function Panel   Lipid panel   Morbid (severe) obesity due to excess calories (HCC)    Weight loss discussed and low calorie diet given.      Relevant Orders   Lipid panel     Other Visit Diagnoses    Annual physical exam    -  Primary   No subjective/objective concerns.   Relevant Orders   Renal Function Panel   Hyperlipidemia, unspecified hyperlipidemia type       Elevated triglycerides and cholesterol. Check lipid panel and a1c.    Relevant Orders   Lipid panel      No orders of the defined types were placed in this encounter.     Dr. Hayden [facility]  03/03/18

## 2018-03-03 NOTE — Assessment & Plan Note (Signed)
Chronic Stable Will check a1c and renal panel.

## 2018-03-03 NOTE — Patient Instructions (Signed)

## 2018-03-04 LAB — RENAL FUNCTION PANEL
Albumin: 4.6 g/dL (ref 3.5–5.5)
BUN / CREAT RATIO: 8 — AB (ref 9–20)
BUN: 10 mg/dL (ref 6–24)
CO2: 26 mmol/L (ref 20–29)
Calcium: 9.2 mg/dL (ref 8.7–10.2)
Chloride: 102 mmol/L (ref 96–106)
Creatinine, Ser: 1.21 mg/dL (ref 0.76–1.27)
GFR calc Af Amer: 81 mL/min/{1.73_m2} (ref >59)
GFR calc non Af Amer: 70 mL/min/{1.73_m2} (ref >59)
GLUCOSE: 94 mg/dL (ref 65–99)
POTASSIUM: 4.2 mmol/L (ref 3.5–5.2)
Phosphorus: 2.9 mg/dL (ref 2.5–4.5)
SODIUM: 142 mmol/L (ref 134–144)

## 2018-03-04 LAB — HEMOGLOBIN A1C
ESTIMATED AVERAGE GLUCOSE: 117 mg/dL
HEMOGLOBIN A1C: 5.7 % — AB (ref 4.8–5.6)

## 2018-03-04 LAB — LIPID PANEL
CHOL/HDL RATIO: 4.1 ratio (ref 0.0–5.0)
Cholesterol, Total: 208 mg/dL — ABNORMAL HIGH (ref 100–199)
HDL: 51 mg/dL (ref >39)
LDL CALC: 128 mg/dL — AB (ref 0–99)
Triglycerides: 143 mg/dL (ref 0–149)
VLDL Cholesterol Cal: 29 mg/dL (ref 5–40)

## 2018-09-14 ENCOUNTER — Ambulatory Visit: Payer: 59 | Admitting: Family Medicine

## 2018-09-14 ENCOUNTER — Encounter: Payer: Self-pay | Admitting: Family Medicine

## 2018-09-14 VITALS — BP 110/80 | HR 72 | Temp 98.4°F | Ht 70.5 in | Wt 266.0 lb

## 2018-09-14 DIAGNOSIS — Z6837 Body mass index (BMI) 37.0-37.9, adult: Secondary | ICD-10-CM | POA: Diagnosis not present

## 2018-09-14 DIAGNOSIS — J0100 Acute maxillary sinusitis, unspecified: Secondary | ICD-10-CM | POA: Diagnosis not present

## 2018-09-14 MED ORDER — AMOXICILLIN-POT CLAVULANATE 875-125 MG PO TABS: 1.0000 | ORAL_TABLET | Freq: Two times a day (BID) | ORAL | 0 refills | Status: DC

## 2018-09-14 NOTE — Patient Instructions (Signed)

## 2018-09-14 NOTE — Progress Notes (Signed)
Date:  09/14/2018   Name:  Richard Frost   DOB:  Jun 05, 1971   MRN:  [id number]   Chief Complaint: Sinusitis (cough and cong- little green production, drainage, hot to cold- taking mucinex)  Sinusitis  This is a new problem. The current episode started in the past 7 days (late Friday). The problem has been gradually worsening since onset. There has been no fever. He is experiencing no pain. Associated symptoms include congestion, coughing, headaches, sinus pressure and a sore throat. Pertinent negatives include no chills, diaphoresis, ear pain, hoarse voice, neck pain, shortness of breath, sneezing or swollen glands. Past treatments include oral decongestants. The treatment provided no relief.    Review of Systems  Constitutional: Negative for chills, diaphoresis and fever.  HENT: Positive for congestion, sinus pressure and sore throat. Negative for drooling, ear discharge, ear pain, hoarse voice and sneezing.   Respiratory: Positive for cough. Negative for shortness of breath and wheezing.   Cardiovascular: Negative for chest pain, palpitations and leg swelling.  Gastrointestinal: Negative for abdominal pain, blood in stool, constipation, diarrhea and nausea.  Endocrine: Negative for polydipsia.  Genitourinary: Negative for dysuria, frequency, hematuria and urgency.  Musculoskeletal: Negative for back pain, myalgias and neck pain.  Skin: Negative for rash.  Allergic/Immunologic: Negative for environmental allergies.  Neurological: Positive for headaches. Negative for dizziness.  Hematological: Does not bruise/bleed easily.  Psychiatric/Behavioral: Negative for suicidal ideas. The patient is not nervous/anxious.     Patient Active Problem List   Diagnosis Date Noted  . Morbid (severe) obesity due to excess calories (HCC) 03/03/2018  . Biliary colic   . Gallstones 08/19/2015  . Fatty liver disease, nonalcoholic 08/19/2015  . Vitamin D deficiency 08/11/2015  . OCD (obsessive  compulsive disorder) 08/08/2015  . Allergic rhinitis 08/08/2015  . Nephrolithiasis 08/08/2015  . Prediabetes 08/08/2015  . OSA treated with BiPAP 08/08/2015  . Obesity, Class II, BMI 35-39.9, with comorbidity 08/08/2015    Allergies  Allergen Reactions  . Septra [Sulfamethoxazole-Trimethoprim] Hives  . Sulfa Antibiotics Hives    Past Surgical History:  Procedure Laterality Date  . CHOLECYSTECTOMY N/A 04/05/2017   Procedure: LAPAROSCOPIC CHOLECYSTECTOMY WITH INTRAOPERATIVE CHOLANGIOGRAM;  Surgeon: Lattie Haw, MD;  Location: [facility] ORS;  Service: General;  Laterality: N/A;  . INTERNAL URETHROTOMY     remove kidney stone  . LITHOTRIPSY    . LITHOTRIPSY     x 7    Social History   Tobacco Use  . Smoking status: Never Smoker  . Smokeless tobacco: Never Used  Substance Use Topics  . Alcohol use: No    Alcohol/week: 0.0 standard drinks  . Drug use: No     Medication list has been reviewed and updated.  Current Meds  Medication Sig  . ARIPiprazole (ABILIFY) 5 MG tablet Take 5 mg by mouth at bedtime. Dr Percell Belt   . cetirizine (ZYRTEC) 10 MG tablet Take 10 mg by mouth at bedtime. otc  . Cholecalciferol 5000 units capsule Take 1 capsule (5,000 Units total) by mouth daily.  . clonazePAM (KLONOPIN) 2 MG tablet Take 1 tablet by mouth at bedtime. Dr Percell Belt  . fluvoxaMINE (LUVOX) 100 MG tablet Take 100 mg by mouth 2 (two) times daily. Dr Percell Belt  . Omega 3 1000 MG CAPS Take 1 capsule by mouth 2 (two) times daily.    PHQ 2/9 Scores 03/03/2018 02/12/2016 11/17/2015 09/04/2015  PHQ - 2 Score 0 0 2 0    Physical Exam HENT:     Head:  Normocephalic.     Jaw: There is normal jaw occlusion.     Right Ear: Hearing, tympanic membrane, ear canal and external ear normal. No decreased hearing noted.     Left Ear: Hearing, tympanic membrane, ear canal and external ear normal. No decreased hearing noted.     Nose:     Right Turbinates: Swollen.     Left Turbinates: Swollen.     Right  Sinus: Maxillary sinus tenderness present. No frontal sinus tenderness.     Left Sinus: Maxillary sinus tenderness present. No frontal sinus tenderness.     Mouth/Throat:     Mouth: Mucous membranes are moist.     Pharynx: No pharyngeal swelling, oropharyngeal exudate, posterior oropharyngeal erythema or uvula swelling.  Eyes:     General: No scleral icterus.       Right eye: No discharge.        Left eye: No discharge.     Conjunctiva/sclera: Conjunctivae normal.     Pupils: Pupils are equal, round, and reactive to light.  Neck:     Musculoskeletal: Normal range of motion and neck supple.     Thyroid: No thyromegaly.     Vascular: No JVD.     Trachea: No tracheal deviation.  Cardiovascular:     Rate and Rhythm: Normal rate and regular rhythm.     Heart sounds: Normal heart sounds. No murmur. No friction rub. No gallop.   Pulmonary:     Effort: No respiratory distress.     Breath sounds: Normal breath sounds. No wheezing or rales.  Abdominal:     General: Bowel sounds are normal.     Palpations: Abdomen is soft. There is no mass.     Tenderness: There is no abdominal tenderness. There is no guarding or rebound.  Musculoskeletal: Normal range of motion.        General: No tenderness.  Lymphadenopathy:     Cervical: No cervical adenopathy.  Skin:    General: Skin is warm.     Findings: No rash.  Neurological:     Mental Status: He is alert and oriented to person, place, and time.     Cranial Nerves: No cranial nerve deficit.     Deep Tendon Reflexes: Reflexes are normal and symmetric.     BP 110/80   Pulse 72   Temp 98.4 F (36.9 C)   Ht 5' 10.5" (1.791 m)   Wt 266 lb (120.7 kg)   BMI 37.63 kg/m    Assessment and Plan: 1. BMI 37.0-37.9, adult Health risks of being over weight were discussed and patient was counseled on weight loss options and exercise.  2. Acute maxillary sinusitis, recurrence not specified Acute persistent will initiate Augmentin 875 twice daily  for 10 days. - amoxicillin-clavulanate (AUGMENTIN) 875-125 MG tablet; Take 1 tablet by mouth 2 (two) times daily.  Dispense: 20 tablet; Refill: 0

## 2018-11-14 ENCOUNTER — Other Ambulatory Visit: Payer: Self-pay | Admitting: Family Medicine

## 2018-11-20 ENCOUNTER — Other Ambulatory Visit: Payer: Self-pay

## 2018-11-20 ENCOUNTER — Encounter: Payer: Self-pay | Admitting: Family Medicine

## 2018-11-20 ENCOUNTER — Ambulatory Visit: Payer: 59 | Admitting: Family Medicine

## 2018-11-20 VITALS — BP 110/64 | HR 64 | Ht 70.5 in | Wt 264.0 lb

## 2018-11-20 DIAGNOSIS — Z6837 Body mass index (BMI) 37.0-37.9, adult: Secondary | ICD-10-CM

## 2018-11-20 DIAGNOSIS — R7303 Prediabetes: Secondary | ICD-10-CM

## 2018-11-20 NOTE — Progress Notes (Signed)
Date:  11/20/2018   Name:  Annell Greeningatrick Scott Mangus   DOB:  12/09/1970   MRN:  [id number]   Chief Complaint: prediabetes (not on meds- needs A1C check)  Diabetes  He presents for his follow-up diabetic visit. Diabetes type: prediabetes. His disease course has been stable. There are no hypoglycemic associated symptoms. Pertinent negatives for hypoglycemia include no dizziness, headaches or nervousness/anxiousness. There are no diabetic associated symptoms. Pertinent negatives for diabetes include no blurred vision, no chest pain, no fatigue, no foot paresthesias, no foot ulcerations, no polydipsia, no polyphagia, no polyuria, no visual change, no weakness and no weight loss. There are no hypoglycemic complications. Symptoms are worsening. There are no diabetic complications. Risk factors for coronary artery disease include obesity, male sex and dyslipidemia. Current diabetic treatment includes diet. He is compliant with treatment all of the time. His weight is stable. He is following a generally healthy diet. Meal planning includes avoidance of concentrated sweets and carbohydrate counting. He participates in exercise daily. His home blood glucose trend is fluctuating minimally. His breakfast blood glucose is taken between 8-9 am. His breakfast blood glucose range is generally 90-110 mg/dl. An ACE inhibitor/angiotensin II receptor blocker is not being taken.    Review of Systems  Constitutional: Negative for chills, fatigue, fever and weight loss.  HENT: Positive for rhinorrhea. Negative for drooling, ear discharge, ear pain, nosebleeds, postnasal drip, sinus pressure, sneezing and sore throat.   Eyes: Negative for blurred vision.  Respiratory: Negative for cough, shortness of breath and wheezing.   Cardiovascular: Negative for chest pain, palpitations and leg swelling.  Gastrointestinal: Negative for abdominal pain, blood in stool, constipation, diarrhea and nausea.  Endocrine: Negative for cold  intolerance, heat intolerance, polydipsia, polyphagia and polyuria.  Genitourinary: Negative for dysuria, frequency, hematuria and urgency.  Musculoskeletal: Negative for back pain, myalgias and neck pain.  Skin: Negative for rash.  Allergic/Immunologic: Negative for environmental allergies.  Neurological: Negative for dizziness, weakness and headaches.  Hematological: Negative for adenopathy. Does not bruise/bleed easily.  Psychiatric/Behavioral: Negative for suicidal ideas. The patient is not nervous/anxious.     Patient Active Problem List   Diagnosis Date Noted  . Morbid (severe) obesity due to excess calories (HCC) 03/03/2018  . Biliary colic   . Gallstones 08/19/2015  . Fatty liver disease, nonalcoholic 08/19/2015  . Vitamin D deficiency 08/11/2015  . OCD (obsessive compulsive disorder) 08/08/2015  . Allergic rhinitis 08/08/2015  . Nephrolithiasis 08/08/2015  . Prediabetes 08/08/2015  . OSA treated with BiPAP 08/08/2015  . Obesity, Class II, BMI 35-39.9, with comorbidity 08/08/2015    Allergies  Allergen Reactions  . Septra [Sulfamethoxazole-Trimethoprim] Hives  . Sulfa Antibiotics Hives    Past Surgical History:  Procedure Laterality Date  . CHOLECYSTECTOMY N/A 04/05/2017   Procedure: LAPAROSCOPIC CHOLECYSTECTOMY WITH INTRAOPERATIVE CHOLANGIOGRAM;  Surgeon: Lattie Hawooper, Richard E, MD;  Location: [facility];  Service: General;  Laterality: N/A;  . INTERNAL URETHROTOMY     remove kidney stone  . LITHOTRIPSY    . LITHOTRIPSY     x 7    Social History   Tobacco Use  . Smoking status: Never Smoker  . Smokeless tobacco: Never Used  Substance Use Topics  . Alcohol use: No    Alcohol/week: 0.0 standard drinks  . Drug use: No     Medication list has been reviewed and updated.  Current Meds  Medication Sig  . ARIPiprazole (ABILIFY) 5 MG tablet Take 5 mg by mouth at bedtime. Dr Percell BeltMillet   .  cetirizine (ZYRTEC) 10 MG tablet Take 10 mg by mouth at bedtime. otc  .  Cholecalciferol 5000 units capsule Take 1 capsule (5,000 Units total) by mouth daily.  . clonazePAM (KLONOPIN) 2 MG tablet Take 1 tablet by mouth at bedtime. Dr Percell Belt  . fluvoxaMINE (LUVOX) 100 MG tablet Take 100 mg by mouth 2 (two) times daily. Dr Percell Belt  . Omega 3 1000 MG CAPS Take 1 capsule by mouth 2 (two) times daily.  Letta Pate VERIO test strip TEST BLOOD SUGAR ONCE DAILY    PHQ 2/9 Scores 11/20/2018 03/03/2018 02/12/2016 11/17/2015  PHQ - 2 Score 0 0 0 2  PHQ- 9 Score 0 - - -    BP Readings from Last 3 Encounters:  11/20/18 110/64  09/14/18 110/80  03/03/18 113/77    Physical Exam Vitals signs and nursing note reviewed.  HENT:     Head: Normocephalic.     Right Ear: External ear normal.     Left Ear: External ear normal.     Nose: Nose normal.  Eyes:     General: No scleral icterus.       Right eye: No discharge.        Left eye: No discharge.     Conjunctiva/sclera: Conjunctivae normal.     Pupils: Pupils are equal, round, and reactive to light.  Neck:     Musculoskeletal: Normal range of motion and neck supple.     Thyroid: No thyromegaly.     Vascular: No JVD.     Trachea: No tracheal deviation.  Cardiovascular:     Rate and Rhythm: Normal rate and regular rhythm.     Heart sounds: Normal heart sounds. No murmur. No friction rub. No gallop.   Pulmonary:     Effort: No respiratory distress.     Breath sounds: Normal breath sounds. No wheezing or rales.  Abdominal:     General: Bowel sounds are normal.     Palpations: Abdomen is soft. There is no mass.     Tenderness: There is no abdominal tenderness. There is no guarding or rebound.  Musculoskeletal: Normal range of motion.        General: No tenderness.  Lymphadenopathy:     Cervical: No cervical adenopathy.  Skin:    General: Skin is warm.     Findings: No rash.  Neurological:     Mental Status: He is alert and oriented to person, place, and time.     Cranial Nerves: No cranial nerve deficit.     Deep  Tendon Reflexes: Reflexes are normal and symmetric.     Wt Readings from Last 3 Encounters:  11/20/18 264 lb (119.7 kg)  09/14/18 266 lb (120.7 kg)  03/03/18 273 lb (123.8 kg)    BP 110/64   Pulse 64   Ht 5' 10.5" (1.791 m)   Wt 264 lb (119.7 kg)   BMI 37.34 kg/m   Assessment and Plan: 1. BMI 37.0-37.9, adult Health risks of being over weight were discussed and patient was counseled on weight loss options and exercise.  2. Prediabetes Patient has a history of prediabetes which is stable.  Will obtain an A1c and a renal function panel as well as a lipid panel.  Patient eats a very diet which does not need to be changed. - Hemoglobin A1c - Renal Function Panel - Lipid panel

## 2018-11-21 LAB — RENAL FUNCTION PANEL
Albumin: 4.6 g/dL (ref 4.0–5.0)
BUN/Creatinine Ratio: 6 — ABNORMAL LOW (ref 9–20)
BUN: 8 mg/dL (ref 6–24)
CO2: 23 mmol/L (ref 20–29)
Calcium: 9.6 mg/dL (ref 8.7–10.2)
Chloride: 103 mmol/L (ref 96–106)
Creatinine, Ser: 1.25 mg/dL (ref 0.76–1.27)
GFR calc Af Amer: 78 mL/min/{1.73_m2} (ref >59)
GFR calc non Af Amer: 67 mL/min/{1.73_m2} (ref >59)
Glucose: 100 mg/dL — ABNORMAL HIGH (ref 65–99)
Phosphorus: 3.1 mg/dL (ref 2.8–4.1)
Potassium: 4.3 mmol/L (ref 3.5–5.2)
Sodium: 142 mmol/L (ref 134–144)

## 2018-11-21 LAB — HEMOGLOBIN A1C
Est. average glucose Bld gHb Est-mCnc: 117 mg/dL
Hgb A1c MFr Bld: 5.7 % — ABNORMAL HIGH (ref 4.8–5.6)

## 2018-11-21 LAB — LIPID PANEL
Chol/HDL Ratio: 4 ratio (ref 0.0–5.0)
Cholesterol, Total: 210 mg/dL — ABNORMAL HIGH (ref 100–199)
HDL: 52 mg/dL (ref >39)
LDL Calculated: 120 mg/dL — ABNORMAL HIGH (ref 0–99)
Triglycerides: 192 mg/dL — ABNORMAL HIGH (ref 0–149)
VLDL Cholesterol Cal: 38 mg/dL (ref 5–40)

## 2019-03-05 ENCOUNTER — Ambulatory Visit (INDEPENDENT_AMBULATORY_CARE_PROVIDER_SITE_OTHER): Payer: 59 | Admitting: Family Medicine

## 2019-03-05 ENCOUNTER — Encounter: Payer: Self-pay | Admitting: Family Medicine

## 2019-03-05 ENCOUNTER — Other Ambulatory Visit: Payer: Self-pay

## 2019-03-05 VITALS — BP 120/60 | HR 72 | Ht 70.5 in | Wt 257.0 lb

## 2019-03-05 DIAGNOSIS — Z0000 Encounter for general adult medical examination without abnormal findings: Secondary | ICD-10-CM | POA: Diagnosis not present

## 2019-03-05 NOTE — Patient Instructions (Signed)

## 2019-03-05 NOTE — Progress Notes (Signed)
Date:  03/05/2019   Name:  Richard Frost   DOB:  08/20/1970   MRN:  [id number]   Chief Complaint: Diabetes (diet controlled 5.7 last A1c in April) and Annual Exam  Patient is a 48 year old male who presents for a comprehensive physical exam. The patient reports the following problems: diet controlled diet. Health maintenance has been reviewed up to date.  Diabetes He presents for his follow-up diabetic visit. He has type 2 diabetes mellitus. His disease course has been stable. There are no hypoglycemic associated symptoms. Pertinent negatives for hypoglycemia include no confusion, dizziness, headaches, nervousness/anxiousness, seizures, speech difficulty or tremors. Pertinent negatives for diabetes include no blurred vision, no chest pain, no fatigue, no foot paresthesias, no foot ulcerations, no polydipsia, no polyphagia, no polyuria, no visual change, no weakness and no weight loss. There are no hypoglycemic complications. Symptoms are stable. There are no diabetic complications.    Review of Systems  Constitutional: Negative for appetite change, chills, fatigue, fever, unexpected weight change and weight loss.  HENT: Negative for ear pain, facial swelling, hearing loss, nosebleeds, sneezing, sore throat and trouble swallowing.   Eyes: Negative for blurred vision, photophobia, pain, discharge, redness, itching and visual disturbance.  Respiratory: Negative for cough, choking, chest tightness, shortness of breath and wheezing.   Cardiovascular: Negative for chest pain, palpitations and leg swelling.  Gastrointestinal: Negative for abdominal pain, blood in stool, constipation, diarrhea, nausea, rectal pain and vomiting.  Endocrine: Negative for cold intolerance, heat intolerance, polydipsia, polyphagia and polyuria.  Genitourinary: Negative for decreased urine volume, difficulty urinating, discharge, dysuria, flank pain, frequency, hematuria, penile pain, penile swelling, scrotal  swelling, testicular pain and urgency.  Musculoskeletal: Negative for back pain, joint swelling, neck pain and neck stiffness.  Skin: Negative for color change and rash.  Allergic/Immunologic: Negative for immunocompromised state.  Neurological: Negative for dizziness, tremors, seizures, syncope, speech difficulty, weakness, light-headedness, numbness and headaches.  Hematological: Does not bruise/bleed easily.  Psychiatric/Behavioral: Negative for agitation, behavioral problems, confusion, dysphoric mood, hallucinations, self-injury and suicidal ideas. The patient is not nervous/anxious.     Patient Active Problem List   Diagnosis Date Noted  . Biliary colic   . Gallstones 08/19/2015  . Fatty liver disease, nonalcoholic 08/19/2015  . Vitamin D deficiency 08/11/2015  . OCD (obsessive compulsive disorder) 08/08/2015  . Allergic rhinitis 08/08/2015  . Nephrolithiasis 08/08/2015  . Prediabetes 08/08/2015  . OSA treated with BiPAP 08/08/2015  . Obesity, Class II, BMI 35-39.9, with comorbidity 08/08/2015    Allergies  Allergen Reactions  . Septra [Sulfamethoxazole-Trimethoprim] Hives  . Sulfa Antibiotics Hives    Past Surgical History:  Procedure Laterality Date  . CHOLECYSTECTOMY N/A 04/05/2017   Procedure: LAPAROSCOPIC CHOLECYSTECTOMY WITH INTRAOPERATIVE CHOLANGIOGRAM;  Surgeon: Lattie Hawooper, Richard E, MD;  Location: [facility];  Service: General;  Laterality: N/A;  . INTERNAL URETHROTOMY     remove kidney stone  . LITHOTRIPSY    . LITHOTRIPSY     x 7    Social History   Tobacco Use  . Smoking status: Never Smoker  . Smokeless tobacco: Never Used  Substance Use Topics  . Alcohol use: No    Alcohol/week: 0.0 standard drinks  . Drug use: No     Medication list has been reviewed and updated.  Current Meds  Medication Sig  . ARIPiprazole (ABILIFY) 5 MG tablet Take 5 mg by mouth at bedtime. Dr Percell BeltMillet   . cetirizine (ZYRTEC) 10 MG tablet Take 10 mg by mouth at bedtime.  otc  .  Cholecalciferol 5000 units capsule Take 1 capsule (5,000 Units total) by mouth daily.  . clonazePAM (KLONOPIN) 2 MG tablet Take 1 tablet by mouth at bedtime. Dr Percell BeltMillet  . fluvoxaMINE (LUVOX) 100 MG tablet Take 100 mg by mouth 2 (two) times daily. Dr Percell BeltMillet  . Omega 3 1000 MG CAPS Take 1 capsule by mouth 2 (two) times daily.  Letta Pate. ONETOUCH VERIO test strip TEST BLOOD SUGAR ONCE DAILY    PHQ 2/9 Scores 11/20/2018 03/03/2018 02/12/2016 11/17/2015  PHQ - 2 Score 0 0 0 2  PHQ- 9 Score 0 - - -    BP Readings from Last 3 Encounters:  03/05/19 120/60  11/20/18 110/64  09/14/18 110/80    Physical Exam Vitals signs and nursing note reviewed.  Constitutional:      Appearance: He is obese.  HENT:     Head: Normocephalic.     Jaw: There is normal jaw occlusion.     Right Ear: Hearing, tympanic membrane, ear canal and external ear normal.     Left Ear: Hearing, tympanic membrane, ear canal and external ear normal.     Nose: Nose normal.     Right Turbinates: Not enlarged or swollen.     Left Turbinates: Not enlarged or swollen.     Mouth/Throat:     Lips: Pink.     Mouth: Mucous membranes are moist.     Dentition: Normal dentition.     Tongue: Tongue does not deviate from midline.  Eyes:     General: Lids are normal. Vision grossly intact. Gaze aligned appropriately. No visual field deficit or scleral icterus.       Right eye: No discharge.        Left eye: No discharge.     Conjunctiva/sclera: Conjunctivae normal.     Pupils: Pupils are equal, round, and reactive to light.     Funduscopic exam:    Right eye: No AV nicking.        Left eye: No AV nicking.  Neck:     Musculoskeletal: Normal range of motion and neck supple.     Thyroid: No thyroid mass, thyromegaly or thyroid tenderness.     Vascular: Normal carotid pulses. No carotid bruit, hepatojugular reflux or JVD.     Trachea: Trachea and phonation normal. No tracheal deviation.  Cardiovascular:     Rate and Rhythm: Normal rate and  regular rhythm.     Chest Wall: PMI is not displaced.     Pulses: Normal pulses. No decreased pulses.          Carotid pulses are 2+ on the right side and 2+ on the left side.      Radial pulses are 2+ on the right side and 2+ on the left side.       Femoral pulses are 2+ on the right side and 2+ on the left side.      Popliteal pulses are 2+ on the right side and 2+ on the left side.       Dorsalis pedis pulses are 2+ on the right side and 2+ on the left side.       Posterior tibial pulses are 2+ on the right side and 2+ on the left side.     Heart sounds: Normal heart sounds, S1 normal and S2 normal. No murmur. No systolic murmur. No diastolic murmur. No friction rub. No gallop. No S3 or S4 sounds.   Pulmonary:     Effort: No  respiratory distress.     Breath sounds: Normal breath sounds. No decreased air movement. No decreased breath sounds, wheezing, rhonchi or rales.  Chest:     Chest wall: No mass.     Breasts: Breasts are symmetrical.        Right: Normal.        Left: Normal.  Abdominal:     General: Bowel sounds are normal.     Palpations: Abdomen is soft. There is no hepatomegaly, splenomegaly, mass or pulsatile mass.     Tenderness: There is no abdominal tenderness. There is no guarding or rebound.     Hernia: No hernia is present.  Genitourinary:    Penis: Normal and uncircumcised.      Scrotum/Testes: Normal.        Right: Mass, tenderness or swelling not present.        Left: Mass, tenderness or swelling not present.     Prostate: Normal. Not enlarged, not tender and no nodules present.     Rectum: Normal. Guaiac result negative. No mass.  Musculoskeletal: Normal range of motion.        General: No tenderness.     Right lower leg: No edema.     Left lower leg: No edema.  Feet:     Right foot:     Protective Sensation: 10 sites tested. 10 sites sensed.     Left foot:     Protective Sensation: 10 sites tested. 10 sites sensed.  Lymphadenopathy:     Head:     Right  side of head: No submental, submandibular or tonsillar adenopathy.     Left side of head: No submental, submandibular or tonsillar adenopathy.     Cervical: No cervical adenopathy.     Right cervical: No superficial, deep or posterior cervical adenopathy.    Left cervical: No superficial, deep or posterior cervical adenopathy.     Upper Body:     Right upper body: No supraclavicular, axillary or pectoral adenopathy.     Left upper body: No supraclavicular, axillary or pectoral adenopathy.  Skin:    General: Skin is warm.     Capillary Refill: Capillary refill takes less than 2 seconds.     Coloration: Skin is pale. Skin is not ashen.     Findings: No rash.  Neurological:     General: No focal deficit present.     Mental Status: He is alert and oriented to person, place, and time.     Cranial Nerves: Cranial nerves are intact. No cranial nerve deficit, dysarthria or facial asymmetry.     Deep Tendon Reflexes: Reflexes are normal and symmetric.     Reflex Scores:      Tricep reflexes are 2+ on the right side and 2+ on the left side.      Bicep reflexes are 2+ on the right side and 2+ on the left side.      Brachioradialis reflexes are 2+ on the right side and 2+ on the left side.      Patellar reflexes are 2+ on the right side and 2+ on the left side.      Achilles reflexes are 2+ on the right side and 2+ on the left side. Psychiatric:        Attention and Perception: Attention and perception normal.        Speech: Speech normal.        Behavior: Behavior normal. Behavior is cooperative.        Cognition and  Memory: Cognition and memory normal.     Wt Readings from Last 3 Encounters:  03/05/19 257 lb (116.6 kg)  11/20/18 264 lb (119.7 kg)  09/14/18 266 lb (120.7 kg)    BP 120/60   Pulse 72   Ht 5' 10.5" (1.791 m)   Wt 257 lb (116.6 kg)   BMI 36.35 kg/m   Assessment and Plan: 1. Annual physical exam No subjective/objective concerns noted during history physical.  Patient's  previous encounters and labs were reviewed.Martine Trageser is a 48 y.o. male who presents today for his Complete Annual Exam. He feels well. He reports exercising . He reports he is sleeping well. Immunizations are reviewed and recommendations provided.   Age appropriate screening tests are discussed. Counseling given for risk factor reduction interventions.

## 2019-05-22 ENCOUNTER — Ambulatory Visit: Payer: Self-pay | Admitting: Family Medicine

## 2019-06-21 ENCOUNTER — Encounter: Payer: Self-pay | Admitting: Family Medicine

## 2019-06-21 ENCOUNTER — Other Ambulatory Visit: Payer: Self-pay

## 2019-06-21 ENCOUNTER — Ambulatory Visit: Payer: 59 | Admitting: Family Medicine

## 2019-06-21 VITALS — BP 110/70 | HR 64 | Ht 70.5 in | Wt 262.0 lb

## 2019-06-21 DIAGNOSIS — K760 Fatty (change of) liver, not elsewhere classified: Secondary | ICD-10-CM | POA: Diagnosis not present

## 2019-06-21 DIAGNOSIS — E782 Mixed hyperlipidemia: Secondary | ICD-10-CM | POA: Diagnosis not present

## 2019-06-21 DIAGNOSIS — R7303 Prediabetes: Secondary | ICD-10-CM | POA: Diagnosis not present

## 2019-06-21 NOTE — Patient Instructions (Signed)

## 2019-06-21 NOTE — Progress Notes (Signed)
Date:  06/21/2019   Name:  Richard Frost   DOB:  07/15/1971   MRN:  [id number]   Chief Complaint: prediabetes (diet controlled)  Diabetes He presents for his follow-up diabetic visit. Diabetes type: prediabetes. His disease course has been stable. There are no hypoglycemic associated symptoms. Pertinent negatives for hypoglycemia include no dizziness, headaches or nervousness/anxiousness. There are no diabetic associated symptoms. Pertinent negatives for diabetes include no chest pain and no polydipsia. There are no hypoglycemic complications. Symptoms are stable. There are no diabetic complications.    Lab Results  Component Value Date   CREATININE 1.25 11/20/2018   BUN 8 11/20/2018   NA 142 11/20/2018   K 4.3 11/20/2018   CL 103 11/20/2018   CO2 23 11/20/2018   Lab Results  Component Value Date   CHOL 210 (H) 11/20/2018   HDL 52 11/20/2018   LDLCALC 120 (H) 11/20/2018   TRIG 192 (H) 11/20/2018   CHOLHDL 4.0 11/20/2018   Lab Results  Component Value Date   TSH 2.150 08/08/2015   Lab Results  Component Value Date   HGBA1C 5.7 (H) 11/20/2018     Review of Systems  Constitutional: Negative for chills and fever.  HENT: Negative for drooling, ear discharge, ear pain and sore throat.   Respiratory: Negative for cough, shortness of breath and wheezing.   Cardiovascular: Negative for chest pain, palpitations and leg swelling.  Gastrointestinal: Negative for abdominal pain, blood in stool, constipation, diarrhea and nausea.  Endocrine: Negative for polydipsia.  Genitourinary: Negative for dysuria, frequency, hematuria and urgency.  Musculoskeletal: Negative for back pain, myalgias and neck pain.  Skin: Negative for rash.  Allergic/Immunologic: Negative for environmental allergies.  Neurological: Negative for dizziness and headaches.  Hematological: Does not bruise/bleed easily.  Psychiatric/Behavioral: Negative for suicidal ideas. The patient is not  nervous/anxious.     Patient Active Problem List   Diagnosis Date Noted  . Biliary colic   . Gallstones 08/19/2015  . Fatty liver disease, nonalcoholic 08/19/2015  . Vitamin D deficiency 08/11/2015  . OCD (obsessive compulsive disorder) 08/08/2015  . Allergic rhinitis 08/08/2015  . Nephrolithiasis 08/08/2015  . Prediabetes 08/08/2015  . OSA treated with BiPAP 08/08/2015  . Obesity, Class II, BMI 35-39.9, with comorbidity 08/08/2015    Allergies  Allergen Reactions  . Septra [Sulfamethoxazole-Trimethoprim] Hives  . Sulfa Antibiotics Hives    Past Surgical History:  Procedure Laterality Date  . CHOLECYSTECTOMY N/A 04/05/2017   Procedure: LAPAROSCOPIC CHOLECYSTECTOMY WITH INTRAOPERATIVE CHOLANGIOGRAM;  Surgeon: Lattie Hawooper, Richard E, MD;  Location: [facility] ORS;  Service: General;  Laterality: N/A;  . INTERNAL URETHROTOMY     remove kidney stone  . LITHOTRIPSY    . LITHOTRIPSY     x 7    Social History   Tobacco Use  . Smoking status: Never Smoker  . Smokeless tobacco: Never Used  Substance Use Topics  . Alcohol use: No    Alcohol/week: 0.0 standard drinks  . Drug use: No     Medication list has been reviewed and updated.  Current Meds  Medication Sig  . ARIPiprazole (ABILIFY) 5 MG tablet Take 5 mg by mouth at bedtime. CBC  . cetirizine (ZYRTEC) 10 MG tablet Take 10 mg by mouth at bedtime. otc  . Cholecalciferol 5000 units capsule Take 1 capsule (5,000 Units total) by mouth daily.  . clonazePAM (KLONOPIN) 2 MG tablet Take 1 tablet by mouth at bedtime. cbc  . fluvoxaMINE (LUVOX) 100 MG tablet Take 100 mg by mouth  2 (two) times daily. cbc  . Omega 3 1000 MG CAPS Take 1 capsule by mouth 2 (two) times daily.  Glory Rosebush VERIO test strip TEST BLOOD SUGAR ONCE DAILY    PHQ 2/9 Scores 06/21/2019 11/20/2018 03/03/2018 02/12/2016  PHQ - 2 Score 0 0 0 0  PHQ- 9 Score 0 0 - -    BP Readings from Last 3 Encounters:  06/21/19 110/70  03/05/19 120/60  11/20/18 110/64     Physical Exam Vitals signs and nursing note reviewed.  HENT:     Head: Normocephalic.     Right Ear: Tympanic membrane, ear canal and external ear normal.     Left Ear: Tympanic membrane, ear canal and external ear normal.     Nose: Nose normal. No congestion.     Mouth/Throat:     Mouth: Mucous membranes are moist.  Eyes:     General: No scleral icterus.       Right eye: No discharge.        Left eye: No discharge.     Conjunctiva/sclera: Conjunctivae normal.     Pupils: Pupils are equal, round, and reactive to light.  Neck:     Musculoskeletal: Normal range of motion and neck supple.     Thyroid: No thyromegaly.     Vascular: No JVD.     Trachea: No tracheal deviation.  Cardiovascular:     Rate and Rhythm: Normal rate and regular rhythm.     Heart sounds: Normal heart sounds. No murmur. No friction rub. No gallop.   Pulmonary:     Effort: Pulmonary effort is normal. No respiratory distress.     Breath sounds: Normal breath sounds. No wheezing, rhonchi or rales.  Abdominal:     General: Bowel sounds are normal. There is no distension.     Palpations: Abdomen is soft. There is no mass.     Tenderness: There is no abdominal tenderness. There is no guarding or rebound.     Hernia: No hernia is present.  Musculoskeletal: Normal range of motion.        General: No tenderness.  Lymphadenopathy:     Cervical: No cervical adenopathy.  Skin:    General: Skin is warm.     Capillary Refill: Capillary refill takes less than 2 seconds.     Findings: No rash.  Neurological:     Mental Status: He is alert and oriented to person, place, and time.     Cranial Nerves: No cranial nerve deficit.     Deep Tendon Reflexes: Reflexes are normal and symmetric.     Wt Readings from Last 3 Encounters:  06/21/19 262 lb (118.8 kg)  03/05/19 257 lb (116.6 kg)  11/20/18 264 lb (119.7 kg)    BP 110/70   Pulse 64   Ht 5' 10.5" (1.791 m)   Wt 262 lb (118.8 kg)   BMI 37.06 kg/m   Assessment  and Plan: 1. Mixed hyperlipidemia Chronic.  Controlled.  Will check a lipid panel.  And if remains elevated will be considering going on atorvastatin. - Lipid Panel With LDL/HDL Ratio  2. Prediabetes Chronic.  Controlled.  Patient doing well on current regimen.  This is diet controlled and will continue as will check an A1c and a renal panel. - Hemoglobin A1c - Lipid Panel With LDL/HDL Ratio - Renal Function Panel  3. Fatty liver disease, nonalcoholic Patient does have a history of fatty liver disease and has had LDLs in the mid to upper 120s.  We will recheck a lipid panel I have discussed the probable necessity of going on a lipid-lowering agent. - Lipid Panel With LDL/HDL Ratio

## 2019-06-22 ENCOUNTER — Other Ambulatory Visit: Payer: Self-pay

## 2019-06-22 DIAGNOSIS — E782 Mixed hyperlipidemia: Secondary | ICD-10-CM

## 2019-06-22 LAB — RENAL FUNCTION PANEL
Albumin: 4.6 g/dL (ref 4.0–5.0)
BUN/Creatinine Ratio: 11 (ref 9–20)
BUN: 13 mg/dL (ref 6–24)
CO2: 25 mmol/L (ref 20–29)
Calcium: 9.8 mg/dL (ref 8.7–10.2)
Chloride: 102 mmol/L (ref 96–106)
Creatinine, Ser: 1.21 mg/dL (ref 0.76–1.27)
GFR calc Af Amer: 81 mL/min/{1.73_m2} (ref >59)
GFR calc non Af Amer: 70 mL/min/{1.73_m2} (ref >59)
Glucose: 94 mg/dL (ref 65–99)
Phosphorus: 3.3 mg/dL (ref 2.8–4.1)
Potassium: 4.3 mmol/L (ref 3.5–5.2)
Sodium: 142 mmol/L (ref 134–144)

## 2019-06-22 LAB — LIPID PANEL WITH LDL/HDL RATIO
Cholesterol, Total: 232 mg/dL — ABNORMAL HIGH (ref 100–199)
HDL: 55 mg/dL (ref >39)
LDL Chol Calc (NIH): 149 mg/dL — ABNORMAL HIGH (ref 0–99)
LDL/HDL Ratio: 2.7 ratio (ref 0.0–3.6)
Triglycerides: 158 mg/dL — ABNORMAL HIGH (ref 0–149)
VLDL Cholesterol Cal: 28 mg/dL (ref 5–40)

## 2019-06-22 LAB — HEMOGLOBIN A1C
Est. average glucose Bld gHb Est-mCnc: 114 mg/dL
Hgb A1c MFr Bld: 5.6 % (ref 4.8–5.6)

## 2019-06-22 MED ORDER — ATORVASTATIN CALCIUM 10 MG PO TABS: 10.0000 mg | ORAL_TABLET | Freq: Every day | ORAL | 1 refills | Status: DC

## 2019-06-22 NOTE — Progress Notes (Unsigned)
Sent in atorv 10mg 

## 2019-07-30 ENCOUNTER — Other Ambulatory Visit: Payer: Self-pay

## 2019-07-30 ENCOUNTER — Other Ambulatory Visit: Payer: 59

## 2019-07-30 DIAGNOSIS — R69 Illness, unspecified: Secondary | ICD-10-CM

## 2019-07-30 DIAGNOSIS — E782 Mixed hyperlipidemia: Secondary | ICD-10-CM

## 2019-07-30 NOTE — Progress Notes (Signed)
Labs printed.

## 2019-07-31 ENCOUNTER — Other Ambulatory Visit: Payer: Self-pay

## 2019-07-31 DIAGNOSIS — E782 Mixed hyperlipidemia: Secondary | ICD-10-CM

## 2019-07-31 LAB — LIPID PANEL WITH LDL/HDL RATIO
Cholesterol, Total: 157 mg/dL (ref 100–199)
HDL: 53 mg/dL (ref >39)
LDL Chol Calc (NIH): 81 mg/dL (ref 0–99)
LDL/HDL Ratio: 1.5 ratio (ref 0.0–3.6)
Triglycerides: 130 mg/dL (ref 0–149)
VLDL Cholesterol Cal: 23 mg/dL (ref 5–40)

## 2019-07-31 LAB — HEPATIC FUNCTION PANEL
ALT: 34 IU/L (ref 0–44)
AST: 27 IU/L (ref 0–40)
Albumin: 4.6 g/dL (ref 4.0–5.0)
Alkaline Phosphatase: 82 IU/L (ref 39–117)
Bilirubin Total: 0.5 mg/dL (ref 0.0–1.2)
Bilirubin, Direct: 0.16 mg/dL (ref 0.00–0.40)
Total Protein: 7.1 g/dL (ref 6.0–8.5)

## 2019-07-31 MED ORDER — ATORVASTATIN CALCIUM 10 MG PO TABS: 10.0000 mg | ORAL_TABLET | Freq: Every day | ORAL | 5 refills | Status: DC

## 2019-08-13 ENCOUNTER — Telehealth: Payer: Self-pay

## 2019-08-13 NOTE — Telephone Encounter (Signed)
Pt called in stating he stepped on a metal box, with socks on, did cause a little area on bottom of foot. Wanted to know about Tetanus shot. It has been 9 years- he will come in and get tetanus shot. Also asked about covid vax-was told that we don't know yet when it will be available to our pts.

## 2019-08-16 ENCOUNTER — Ambulatory Visit (INDEPENDENT_AMBULATORY_CARE_PROVIDER_SITE_OTHER): Payer: 59

## 2019-08-16 ENCOUNTER — Other Ambulatory Visit: Payer: Self-pay

## 2019-08-16 DIAGNOSIS — Z23 Encounter for immunization: Secondary | ICD-10-CM | POA: Diagnosis not present

## 2019-10-18 ENCOUNTER — Ambulatory Visit: Payer: 59 | Admitting: Family Medicine

## 2019-10-18 ENCOUNTER — Other Ambulatory Visit: Payer: Self-pay

## 2019-10-18 ENCOUNTER — Encounter: Payer: Self-pay | Admitting: Family Medicine

## 2019-10-18 VITALS — BP 110/76 | HR 80 | Ht 70.5 in | Wt 265.0 lb

## 2019-10-18 DIAGNOSIS — Z01818 Encounter for other preprocedural examination: Secondary | ICD-10-CM

## 2019-10-18 NOTE — Progress Notes (Signed)
Date:  10/18/2019   Name:  Lynda Capistran   DOB:  20-Apr-1971   MRN:  [id number]   Chief Complaint: Pre-op Exam (for [facility] urology)  Patient is a 49 year old male who presents for a preoperative  Exam for basket removale of nephrolithiasis. The patient reports the following problems: recurrent kidney stones. Health maintenance has been reviewed colonoscopy   Lab Results  Component Value Date   CREATININE 1.21 06/21/2019   BUN 13 06/21/2019   NA 142 06/21/2019   K 4.3 06/21/2019   CL 102 06/21/2019   CO2 25 06/21/2019   Lab Results  Component Value Date   CHOL 157 07/30/2019   HDL 53 07/30/2019   LDLCALC 81 07/30/2019   TRIG 130 07/30/2019   CHOLHDL 4.0 11/20/2018   Lab Results  Component Value Date   TSH 2.150 08/08/2015   Lab Results  Component Value Date   HGBA1C 5.6 06/21/2019     Review of Systems  Constitutional: Negative for chills and fever.  HENT: Negative for drooling, ear discharge, ear pain and sore throat.   Respiratory: Negative for cough, shortness of breath and wheezing.   Cardiovascular: Negative for chest pain, palpitations and leg swelling.  Gastrointestinal: Negative for abdominal pain, blood in stool, constipation, diarrhea and nausea.  Endocrine: Negative for polydipsia.  Genitourinary: Negative for dysuria, frequency, hematuria and urgency.  Musculoskeletal: Negative for back pain, myalgias and neck pain.  Skin: Negative for rash.  Allergic/Immunologic: Negative for environmental allergies.  Neurological: Negative for dizziness and headaches.  Hematological: Does not bruise/bleed easily.  Psychiatric/Behavioral: Negative for suicidal ideas. The patient is not nervous/anxious.     Patient Active Problem List   Diagnosis Date Noted  . Biliary colic   . Gallstones 08/19/2015  . Fatty liver disease, nonalcoholic 79/89/2119  . Vitamin D deficiency 08/11/2015  . OCD (obsessive compulsive disorder) 08/08/2015  . Allergic rhinitis  08/08/2015  . Nephrolithiasis 08/08/2015  . Prediabetes 08/08/2015  . OSA treated with BiPAP 08/08/2015  . Obesity, Class II, BMI 35-39.9, with comorbidity 08/08/2015    Allergies  Allergen Reactions  . Septra [Sulfamethoxazole-Trimethoprim] Hives  . Sulfa Antibiotics Hives    Past Surgical History:  Procedure Laterality Date  . CHOLECYSTECTOMY N/A 04/05/2017   Procedure: LAPAROSCOPIC CHOLECYSTECTOMY WITH INTRAOPERATIVE CHOLANGIOGRAM;  Surgeon: Florene Glen, MD;  Location: [facility] ORS;  Service: General;  Laterality: N/A;  . INTERNAL URETHROTOMY     remove kidney stone  . LITHOTRIPSY    . LITHOTRIPSY     x 7    Social History   Tobacco Use  . Smoking status: Never Smoker  . Smokeless tobacco: Never Used  Substance Use Topics  . Alcohol use: No    Alcohol/week: 0.0 standard drinks  . Drug use: No     Medication list has been reviewed and updated.  Current Meds  Medication Sig  . ARIPiprazole (ABILIFY) 5 MG tablet Take 5 mg by mouth at bedtime. CBC  . atorvastatin (LIPITOR) 10 MG tablet Take 1 tablet (10 mg total) by mouth daily.  . cetirizine (ZYRTEC) 10 MG tablet Take 10 mg by mouth at bedtime. otc  . Cholecalciferol 5000 units capsule Take 1 capsule (5,000 Units total) by mouth daily.  . clonazePAM (KLONOPIN) 2 MG tablet Take 1 tablet by mouth at bedtime. cbc  . fluvoxaMINE (LUVOX) 100 MG tablet Take 100 mg by mouth 2 (two) times daily. cbc  . Omega 3 1000 MG CAPS Take 1 capsule by  mouth 2 (two) times daily.  Letta Pate VERIO test strip TEST BLOOD SUGAR ONCE DAILY    PHQ 2/9 Scores 06/21/2019 11/20/2018 03/03/2018 02/12/2016  PHQ - 2 Score 0 0 0 0  PHQ- 9 Score 0 0 - -    BP Readings from Last 3 Encounters:  10/18/19 110/76  06/21/19 110/70  03/05/19 120/60    Physical Exam Vitals and nursing note reviewed.  HENT:     Head: Normocephalic.     Right Ear: External ear normal.     Left Ear: External ear normal.     Nose: Nose normal.  Eyes:     General:  No scleral icterus.       Right eye: No discharge.        Left eye: No discharge.     Conjunctiva/sclera: Conjunctivae normal.     Pupils: Pupils are equal, round, and reactive to light.  Neck:     Thyroid: No thyromegaly.     Vascular: No JVD.     Trachea: No tracheal deviation.  Cardiovascular:     Rate and Rhythm: Normal rate and regular rhythm.     Heart sounds: Normal heart sounds. No murmur. No friction rub. No gallop.   Pulmonary:     Effort: No respiratory distress.     Breath sounds: Normal breath sounds. No wheezing or rales.  Abdominal:     General: Bowel sounds are normal.     Palpations: Abdomen is soft. There is no mass.     Tenderness: There is no abdominal tenderness. There is no guarding or rebound.  Musculoskeletal:        General: No tenderness. Normal range of motion.     Cervical back: Normal range of motion and neck supple.  Lymphadenopathy:     Cervical: No cervical adenopathy.  Skin:    General: Skin is warm.     Findings: No rash.  Neurological:     Mental Status: He is alert and oriented to person, place, and time.     Cranial Nerves: No cranial nerve deficit.     Deep Tendon Reflexes: Reflexes are normal and symmetric.     Wt Readings from Last 3 Encounters:  10/18/19 265 lb (120.2 kg)  06/21/19 262 lb (118.8 kg)  03/05/19 257 lb (116.6 kg)    BP 110/76   Pulse 80   Ht 5' 10.5" (1.791 m)   Wt 265 lb (120.2 kg)   BMI 37.49 kg/m   Assessment and Plan:  1. Preoperative examination Preop physical exam is done and there is no contraindication noted during the subjective/objective evaluation of the exam.  Patient has no contraindications to anesthesia or nor surgical procedure.  Patient is on aspirin and he may stop it for 48 to 72 hours prior to procedure to decrease bleeding concerns.  EKG was done. I have reviewed EKG which shows normal sinus rhythm, rate 64, no hypertrophy is noted by EKG criteria, no ischemic changes noted by EKG criteria  noted.. Comparison to previous EKG dated none to compare. - EKG 12-Lead

## 2020-01-06 ENCOUNTER — Other Ambulatory Visit: Payer: Self-pay | Admitting: Family Medicine

## 2020-01-06 NOTE — Telephone Encounter (Signed)
Requested Prescriptions  Pending Prescriptions Disp Refills  . ONETOUCH VERIO test strip [employer] Med Name: ONETOUCH VERIO STRIP] 100 each 3    Sig: TEST BLOOD SUGAR ONCE DAILY     Endocrinology: Diabetes - Testing Supplies Passed - 01/06/2020  1:49 PM      Passed - Valid encounter within last 12 months    Recent Outpatient Visits          2 months ago Preoperative examination   [facility] Duanne Limerick, MD   6 months ago Prediabetes   [facility] Duanne Limerick, MD   10 months ago Annual physical exam   [facility] Duanne Limerick, MD   1 year ago Prediabetes   [facility] Duanne Limerick, MD   1 year ago Acute maxillary sinusitis, recurrence not specified   [facility] Duanne Limerick, MD             '

## 2020-01-17 ENCOUNTER — Other Ambulatory Visit: Payer: Self-pay | Admitting: Family Medicine

## 2020-01-17 DIAGNOSIS — E782 Mixed hyperlipidemia: Secondary | ICD-10-CM

## 2020-01-17 NOTE — Telephone Encounter (Signed)
Requested Prescriptions  Pending Prescriptions Disp Refills  . atorvastatin (LIPITOR) 10 MG tablet [Pharmacy Med Name: ATORVASTATIN CALCIUM 10 MG TAB] 90 tablet 0    Sig: TAKE 1 TABLET BY MOUTH ONCE DAILY     Cardiovascular:  Antilipid - Statins Failed - 01/17/2020  4:40 PM      Failed - LDL in normal range and within 360 days    LDL Chol Calc (NIH)  Date Value Ref Range Status  07/30/2019 81 0 - 99 mg/dL Final         Passed - Total Cholesterol in normal range and within 360 days    Cholesterol, Total  Date Value Ref Range Status  07/30/2019 157 100 - 199 mg/dL Final         Passed - HDL in normal range and within 360 days    HDL  Date Value Ref Range Status  07/30/2019 53 >39 mg/dL Final         Passed - Triglycerides in normal range and within 360 days    Triglycerides  Date Value Ref Range Status  07/30/2019 130 0 - 149 mg/dL Final         Passed - Patient is not pregnant      Passed - Valid encounter within last 12 months    Recent Outpatient Visits          3 months ago Preoperative examination   [facility] Duanne Limerick, MD   7 months ago Prediabetes   [facility] Duanne Limerick, MD   10 months ago Annual physical exam   [facility] Duanne Limerick, MD   1 year ago Prediabetes   [facility] Duanne Limerick, MD   1 year ago Acute maxillary sinusitis, recurrence not specified   [facility] Duanne Limerick, MD

## 2020-02-16 ENCOUNTER — Other Ambulatory Visit: Payer: Self-pay | Admitting: Family Medicine

## 2020-02-16 DIAGNOSIS — E782 Mixed hyperlipidemia: Secondary | ICD-10-CM

## 2020-02-16 NOTE — Telephone Encounter (Signed)
Requested Prescriptions  Pending Prescriptions Disp Refills  . atorvastatin (LIPITOR) 10 MG tablet [Pharmacy Med Name: ATORVASTATIN CALCIUM 10 MG TAB] 90 tablet 1    Sig: TAKE 1 TABLET BY MOUTH ONCE DAILY     Cardiovascular:  Antilipid - Statins Failed - 02/16/2020 12:13 PM      Failed - LDL in normal range and within 360 days    LDL Chol Calc (NIH)  Date Value Ref Range Status  07/30/2019 81 0 - 99 mg/dL Final         Passed - Total Cholesterol in normal range and within 360 days    Cholesterol, Total  Date Value Ref Range Status  07/30/2019 157 100 - 199 mg/dL Final         Passed - HDL in normal range and within 360 days    HDL  Date Value Ref Range Status  07/30/2019 53 >39 mg/dL Final         Passed - Triglycerides in normal range and within 360 days    Triglycerides  Date Value Ref Range Status  07/30/2019 130 0 - 149 mg/dL Final         Passed - Patient is not pregnant      Passed - Valid encounter within last 12 months    Recent Outpatient Visits          4 months ago Preoperative examination   [facility] Duanne Limerick, MD   8 months ago Prediabetes   [facility] Duanne Limerick, MD   11 months ago Annual physical exam   [facility] Duanne Limerick, MD   1 year ago Prediabetes   [facility] Duanne Limerick, MD   1 year ago Acute maxillary sinusitis, recurrence not specified   [facility] Duanne Limerick, MD

## 2020-05-07 ENCOUNTER — Encounter: Payer: Self-pay | Admitting: Family Medicine

## 2020-05-07 ENCOUNTER — Ambulatory Visit (INDEPENDENT_AMBULATORY_CARE_PROVIDER_SITE_OTHER): Payer: 59 | Admitting: Family Medicine

## 2020-05-07 ENCOUNTER — Other Ambulatory Visit: Payer: Self-pay

## 2020-05-07 VITALS — BP 120/70 | HR 68 | Ht 70.0 in | Wt 271.0 lb

## 2020-05-07 DIAGNOSIS — Z23 Encounter for immunization: Secondary | ICD-10-CM

## 2020-05-07 DIAGNOSIS — Z1211 Encounter for screening for malignant neoplasm of colon: Secondary | ICD-10-CM

## 2020-05-07 DIAGNOSIS — R7303 Prediabetes: Secondary | ICD-10-CM | POA: Diagnosis not present

## 2020-05-07 DIAGNOSIS — E782 Mixed hyperlipidemia: Secondary | ICD-10-CM

## 2020-05-07 DIAGNOSIS — K760 Fatty (change of) liver, not elsewhere classified: Secondary | ICD-10-CM | POA: Diagnosis not present

## 2020-05-07 DIAGNOSIS — Z6837 Body mass index (BMI) 37.0-37.9, adult: Secondary | ICD-10-CM | POA: Diagnosis not present

## 2020-05-07 DIAGNOSIS — Z0000 Encounter for general adult medical examination without abnormal findings: Secondary | ICD-10-CM | POA: Diagnosis not present

## 2020-05-07 MED ORDER — ATORVASTATIN CALCIUM 10 MG PO TABS: 10.0000 mg | ORAL_TABLET | Freq: Every day | ORAL | 1 refills | Status: DC

## 2020-05-07 NOTE — Progress Notes (Signed)
Date:  05/07/2020   Name:  Richard Frost   DOB:  03/26/1971   MRN:  [id number]   Chief Complaint: Annual Exam, Prediabetes, and Flu Vaccine  Patient is a 49 year old male who presents for a comprehensive physical exam. The patient reports the following problems: prediabetic. Health maintenance has been reviewed colonoscopy.  Diabetes He presents for his follow-up diabetic visit. He has type 2 diabetes mellitus. His disease course has been stable. There are no hypoglycemic associated symptoms. Pertinent negatives for hypoglycemia include no dizziness, headaches or nervousness/anxiousness. There are no diabetic associated symptoms. Pertinent negatives for diabetes include no chest pain and no polydipsia. There are no hypoglycemic complications. Symptoms are stable. There are no diabetic complications. Risk factors for coronary artery disease include hypertension and dyslipidemia. Current diabetic treatment includes diet.    Lab Results  Component Value Date   CREATININE 1.21 06/21/2019   BUN 13 06/21/2019   NA 142 06/21/2019   K 4.3 06/21/2019   CL 102 06/21/2019   CO2 25 06/21/2019   Lab Results  Component Value Date   CHOL 157 07/30/2019   HDL 53 07/30/2019   LDLCALC 81 07/30/2019   TRIG 130 07/30/2019   CHOLHDL 4.0 11/20/2018   Lab Results  Component Value Date   TSH 2.150 08/08/2015   Lab Results  Component Value Date   HGBA1C 5.6 06/21/2019   Lab Results  Component Value Date   WBC 6.7 04/04/2017   HGB 14.1 04/04/2017   HCT 42.9 04/04/2017   MCV 90.9 04/04/2017   PLT 178 04/04/2017   Lab Results  Component Value Date   ALT 34 07/30/2019   AST 27 07/30/2019   ALKPHOS 82 07/30/2019   BILITOT 0.5 07/30/2019     Review of Systems  Constitutional: Negative for chills and fever.  HENT: Negative for drooling, ear discharge, ear pain and sore throat.   Respiratory: Negative for cough, shortness of breath and wheezing.   Cardiovascular: Negative for  chest pain, palpitations and leg swelling.  Gastrointestinal: Negative for abdominal pain, blood in stool, constipation, diarrhea and nausea.  Endocrine: Negative for polydipsia.  Genitourinary: Negative for dysuria, frequency, hematuria and urgency.  Musculoskeletal: Negative for back pain, myalgias and neck pain.  Skin: Negative for rash.  Allergic/Immunologic: Negative for environmental allergies.  Neurological: Negative for dizziness and headaches.  Hematological: Does not bruise/bleed easily.  Psychiatric/Behavioral: Negative for suicidal ideas. The patient is not nervous/anxious.     Patient Active Problem List   Diagnosis Date Noted  . Biliary colic   . Gallstones 08/19/2015  . Fatty liver disease, nonalcoholic 08/19/2015  . Vitamin D deficiency 08/11/2015  . OCD (obsessive compulsive disorder) 08/08/2015  . Allergic rhinitis 08/08/2015  . Nephrolithiasis 08/08/2015  . Prediabetes 08/08/2015  . OSA treated with BiPAP 08/08/2015  . Obesity, Class II, BMI 35-39.9, with comorbidity 08/08/2015    Allergies  Allergen Reactions  . Septra [Sulfamethoxazole-Trimethoprim] Hives  . Sulfa Antibiotics Hives    Past Surgical History:  Procedure Laterality Date  . CHOLECYSTECTOMY N/A 04/05/2017   Procedure: LAPAROSCOPIC CHOLECYSTECTOMY WITH INTRAOPERATIVE CHOLANGIOGRAM;  Surgeon: Lattie Hawooper, Richard E, MD;  Location: [facility] ORS;  Service: General;  Laterality: N/A;  . INTERNAL URETHROTOMY     remove kidney stone  . LITHOTRIPSY    . LITHOTRIPSY     x 7    Social History   Tobacco Use  . Smoking status: Never Smoker  . Smokeless tobacco: Never Used  Vaping Use  .  Vaping Use: Never used  Substance Use Topics  . Alcohol use: No    Alcohol/week: 0.0 standard drinks  . Drug use: No     Medication list has been reviewed and updated.  Current Meds  Medication Sig  . ARIPiprazole (ABILIFY) 15 MG tablet Take 15 mg by mouth at bedtime. CBC  . atorvastatin (LIPITOR) 10 MG tablet  TAKE 1 TABLET BY MOUTH ONCE DAILY  . cetirizine (ZYRTEC) 10 MG tablet Take 10 mg by mouth at bedtime. otc  . clonazePAM (KLONOPIN) 2 MG tablet Take 1 tablet by mouth at bedtime. cbc  . fluvoxaMINE (LUVOX) 100 MG tablet Take 100 mg by mouth 2 (two) times daily. cbc  . Omega 3 1000 MG CAPS Take 1 capsule by mouth 2 (two) times daily.  Letta Pate VERIO test strip TEST BLOOD SUGAR ONCE DAILY  . [DISCONTINUED] Cholecalciferol 5000 units capsule Take 1 capsule (5,000 Units total) by mouth daily.    PHQ 2/9 Scores 05/07/2020 06/21/2019 11/20/2018 03/03/2018  PHQ - 2 Score 0 0 0 0  PHQ- 9 Score 0 0 0 -    GAD 7 : Generalized Anxiety Score 05/07/2020 06/21/2019  Nervous, Anxious, on Edge 0 0  Control/stop worrying 0 0  Worry too much - different things 0 0  Trouble relaxing 0 0  Restless 0 0  Easily annoyed or irritable 0 0  Afraid - awful might happen 0 0  Total GAD 7 Score 0 0    BP Readings from Last 3 Encounters:  05/07/20 120/70  10/18/19 110/76  06/21/19 110/70    Physical Exam Vitals reviewed.  Constitutional:      Appearance: Normal appearance. He is well-groomed. He is obese.  HENT:     Head: Normocephalic.     Jaw: There is normal jaw occlusion.     Right Ear: Tympanic membrane, ear canal and external ear normal.     Left Ear: Tympanic membrane, ear canal and external ear normal.     Nose: Nose normal. No congestion or rhinorrhea.     Mouth/Throat:     Lips: Pink.     Mouth: Mucous membranes are moist.     Dentition: Normal dentition.     Pharynx: Oropharynx is clear. Uvula midline.  Eyes:     General: Lids are normal. Vision grossly intact. Gaze aligned appropriately. No scleral icterus.       Right eye: No discharge.        Left eye: No discharge.     Extraocular Movements: Extraocular movements intact.     Conjunctiva/sclera: Conjunctivae normal.     Pupils: Pupils are equal, round, and reactive to light.     Funduscopic exam:    Right eye: Red reflex present.         Left eye: Red reflex present. Neck:     Thyroid: No thyroid mass, thyromegaly or thyroid tenderness.     Vascular: No carotid bruit or JVD.     Trachea: No tracheal deviation.  Cardiovascular:     Rate and Rhythm: Normal rate and regular rhythm.     Chest Wall: PMI is not displaced.     Pulses:          Carotid pulses are 2+ on the right side and 2+ on the left side.      Radial pulses are 2+ on the right side and 2+ on the left side.       Femoral pulses are 2+ on the right side and  2+ on the left side.      Popliteal pulses are 2+ on the right side and 2+ on the left side.       Dorsalis pedis pulses are 2+ on the right side and 2+ on the left side.       Posterior tibial pulses are 2+ on the right side and 2+ on the left side.     Heart sounds: Normal heart sounds. No murmur heard.  No friction rub. No gallop.   Pulmonary:     Effort: Pulmonary effort is normal. No respiratory distress.     Breath sounds: Normal breath sounds. No decreased breath sounds, wheezing, rhonchi or rales.  Chest:     Chest wall: No mass.     Breasts: Breasts are symmetrical.        Right: Normal. No swelling or mass.        Left: Normal. No swelling or mass.  Abdominal:     General: Bowel sounds are normal.     Palpations: Abdomen is soft. There is no hepatomegaly, splenomegaly or mass.     Tenderness: There is no abdominal tenderness. There is no guarding or rebound.  Genitourinary:    Penis: Normal and uncircumcised.      Testes: Normal.     Epididymis:     Right: Normal.     Prostate: Normal. Not enlarged, not tender and no nodules present.     Rectum: Normal. Guaiac result negative. No mass.  Musculoskeletal:        General: No tenderness. Normal range of motion.     Cervical back: Normal range of motion and neck supple.  Feet:     Right foot:     Protective Sensation: 10 sites tested. 10 sites sensed.     Skin integrity: Dry skin present. No ulcer.     Left foot:     Protective  Sensation: 10 sites tested. 10 sites sensed.     Skin integrity: Dry skin present. No ulcer.  Lymphadenopathy:     Head:     Right side of head: No submental, submandibular or tonsillar adenopathy.     Left side of head: No submental, submandibular or tonsillar adenopathy.     Cervical: No cervical adenopathy.     Right cervical: No superficial, deep or posterior cervical adenopathy.    Left cervical: No superficial, deep or posterior cervical adenopathy.     Upper Body:     Right upper body: No supraclavicular adenopathy.     Left upper body: No supraclavicular adenopathy.  Skin:    General: Skin is warm.     Capillary Refill: Capillary refill takes less than 2 seconds.     Findings: No rash.  Neurological:     Mental Status: He is alert and oriented to person, place, and time.     Cranial Nerves: Cranial nerves are intact. No cranial nerve deficit.     Sensory: Sensation is intact.     Motor: Motor function is intact.     Deep Tendon Reflexes: Reflexes are normal and symmetric.     Reflex Scores:      Tricep reflexes are 2+ on the right side and 2+ on the left side.      Bicep reflexes are 2+ on the right side and 2+ on the left side.      Brachioradialis reflexes are 2+ on the right side and 2+ on the left side.      Patellar reflexes are 2+  on the right side and 2+ on the left side.      Achilles reflexes are 2+ on the right side and 2+ on the left side.    Wt Readings from Last 3 Encounters:  05/07/20 271 lb (122.9 kg)  10/18/19 265 lb (120.2 kg)  06/21/19 262 lb (118.8 kg)    BP 120/70   Pulse 68   Ht 5\' 10"  (1.778 m)   Wt 271 lb (122.9 kg)   BMI 38.88 kg/m    Assessment and Plan: 1. Annual physical exam No subjective/objective concerns noted during history and physical exam.  Patient's chart was reviewed for previous encounters including most recent labs most recent imaging as well as care everywhere.Traven Davids is a 49 y.o. male who presents today for  his Complete Annual Exam. He feels well. He reports exercising . He reports he is sleeping well.Immunizations are reviewed and recommendations provided.   Age appropriate screening tests are discussed. Counseling given for risk factor reduction interventions.  We will proceed with a renal function panel.  2. Prediabetes Chronic.  Controlled.  Stable.  We will check A1c today. - HgB A1c - Renal Function Panel  3. Fatty liver disease, nonalcoholic Chronic.  Controlled.  Stable.  Patient has a history of steatosis for which we will check lipid panel as well as hepatic function panel and renal panel as well. - Lipid Panel With LDL/HDL Ratio - Hepatic Function Panel (6) - Renal Function Panel  4. BMI 37.0-37.9, adult Health risks of being over weight were discussed and patient was counseled on weight loss options and exercise.  5. Mixed hyperlipidemia Chronic.  Controlled.  Stable.  Continue atorvastatin 10 mg once a day.  Will check lipid panel. - Lipid Panel With LDL/HDL Ratio - atorvastatin (LIPITOR) 10 MG tablet; Take 1 tablet (10 mg total) by mouth daily.  Dispense: 90 tablet; Refill: 1  6. Colon cancer screening Discussed with patient and referral made to gastroenterology for colonoscopy for cancer screening purposes.  Guaiac was noted to be negative today. - Ambulatory referral to Gastroenterology  7. Need for immunization against influenza Discussed and administered. - Flu Vaccine QUAD 36+ mos IM

## 2020-05-08 ENCOUNTER — Telehealth: Payer: Self-pay | Admitting: Family Medicine

## 2020-05-08 LAB — RENAL FUNCTION PANEL
Albumin: 5 g/dL (ref 4.0–5.0)
BUN/Creatinine Ratio: 10 (ref 9–20)
BUN: 12 mg/dL (ref 6–24)
CO2: 24 mmol/L (ref 20–29)
Calcium: 9.7 mg/dL (ref 8.7–10.2)
Chloride: 103 mmol/L (ref 96–106)
Creatinine, Ser: 1.23 mg/dL (ref 0.76–1.27)
GFR calc Af Amer: 79 mL/min/{1.73_m2} (ref >59)
GFR calc non Af Amer: 68 mL/min/{1.73_m2} (ref >59)
Glucose: 108 mg/dL — ABNORMAL HIGH (ref 65–99)
Phosphorus: 2.9 mg/dL (ref 2.8–4.1)
Potassium: 4.4 mmol/L (ref 3.5–5.2)
Sodium: 144 mmol/L (ref 134–144)

## 2020-05-08 LAB — HEPATIC FUNCTION PANEL (6)
ALT: 42 IU/L (ref 0–44)
AST: 34 IU/L (ref 0–40)
Alkaline Phosphatase: 108 IU/L (ref 44–121)
Bilirubin Total: 0.9 mg/dL (ref 0.0–1.2)
Bilirubin, Direct: 0.23 mg/dL (ref 0.00–0.40)

## 2020-05-08 LAB — LIPID PANEL WITH LDL/HDL RATIO
Cholesterol, Total: 181 mg/dL (ref 100–199)
HDL: 59 mg/dL (ref >39)
LDL Chol Calc (NIH): 98 mg/dL (ref 0–99)
LDL/HDL Ratio: 1.7 ratio (ref 0.0–3.6)
Triglycerides: 137 mg/dL (ref 0–149)
VLDL Cholesterol Cal: 24 mg/dL (ref 5–40)

## 2020-05-08 LAB — HEMOGLOBIN A1C
Est. average glucose Bld gHb Est-mCnc: 131 mg/dL
Hgb A1c MFr Bld: 6.2 % — ABNORMAL HIGH (ref 4.8–5.6)

## 2020-05-08 NOTE — Telephone Encounter (Signed)
Spoke to pt about labs 

## 2020-05-08 NOTE — Telephone Encounter (Unsigned)
Copied from CRM [id number]. Topic: General - Inquiry >> May 08, 2020  9:47 AM Daphine Deutscher D wrote: Reason for CRM: Pt called asking about his lab results.  He seen them on mychart and wants to talk to a nurse  CB# [phone]

## 2020-05-14 ENCOUNTER — Telehealth (INDEPENDENT_AMBULATORY_CARE_PROVIDER_SITE_OTHER): Payer: Self-pay | Admitting: Gastroenterology

## 2020-05-14 ENCOUNTER — Other Ambulatory Visit: Payer: Self-pay

## 2020-05-14 DIAGNOSIS — Z1211 Encounter for screening for malignant neoplasm of colon: Secondary | ICD-10-CM

## 2020-05-14 MED ORDER — NA SULFATE-K SULFATE-MG SULF 17.5-3.13-1.6 GM/177ML PO SOLN: 1.0000 | Freq: Once | ORAL | 0 refills | Status: AC

## 2020-05-14 NOTE — Progress Notes (Signed)
Gastroenterology Pre-Procedure Review  Request Date: 06/04/20 Requesting Physician: Dr. Marius Ditch  PATIENT REVIEW QUESTIONS: The patient responded to the following health history questions as indicated:    1. Are you having any GI issues? no 2. Do you have a personal history of Polyps? no 3. Do you have a family history of Colon Cancer or Polyps? no 4. Diabetes Mellitus? no 5. Joint replacements in the past 12 months?no 6. Major health problems in the past 3 months?no 7. Any artificial heart valves, MVP, or defibrillator?no    MEDICATIONS & ALLERGIES:    Patient reports the following regarding taking any anticoagulation/antiplatelet therapy:   Plavix, Coumadin, Eliquis, Xarelto, Lovenox, Pradaxa, Brilinta, or Effient? no Aspirin? no  Patient confirms/reports the following medications:  Current Outpatient Medications  Medication Sig Dispense Refill  . ARIPiprazole (ABILIFY) 15 MG tablet Take 15 mg by mouth at bedtime. CBC    . atorvastatin (LIPITOR) 10 MG tablet Take 1 tablet (10 mg total) by mouth daily. 90 tablet 1  . cetirizine (ZYRTEC) 10 MG tablet Take 10 mg by mouth at bedtime. otc    . clonazePAM (KLONOPIN) 2 MG tablet Take 1 tablet by mouth at bedtime. cbc    . fluvoxaMINE (LUVOX) 100 MG tablet Take 100 mg by mouth 2 (two) times daily. cbc    . ONETOUCH VERIO test strip TEST BLOOD SUGAR ONCE DAILY 50 each 2  . Na Sulfate-K Sulfate-Mg Sulf 17.5-3.13-1.6 GM/177ML SOLN Take 1 kit by mouth once for 1 dose. 354 mL 0  . Omega 3 1000 MG CAPS Take 1 capsule by mouth 2 (two) times daily. (Patient not taking: Reported on 05/14/2020)     No current facility-administered medications for this visit.    Patient confirms/reports the following allergies:  Allergies  Allergen Reactions  . Septra [Sulfamethoxazole-Trimethoprim] Hives  . Sulfa Antibiotics Hives    Orders Placed This Encounter  Procedures  . Procedural/ Surgical Case Request: COLONOSCOPY WITH PROPOFOL    Standing Status:    Standing    Number of Occurrences:   1    Order Specific Question:   Pre-op diagnosis    Answer:   Colon cancer screening    Order Specific Question:   CPT Code    Answer:   24097    AUTHORIZATION INFORMATION Primary Insurance: 1D#: Group #:  Secondary Insurance: 1D#: Group #:  SCHEDULE INFORMATION: Date: 06/04/20 Time: Location:[facility]

## 2020-05-15 ENCOUNTER — Other Ambulatory Visit: Payer: Self-pay

## 2020-05-15 DIAGNOSIS — Z1211 Encounter for screening for malignant neoplasm of colon: Secondary | ICD-10-CM

## 2020-05-16 ENCOUNTER — Telehealth: Payer: Self-pay | Admitting: Family Medicine

## 2020-05-16 NOTE — Telephone Encounter (Signed)
Patient is calling to confirm date of his scheduled colonoscopy please advise [phone]

## 2020-05-19 NOTE — Telephone Encounter (Signed)
noted 

## 2020-06-02 ENCOUNTER — Other Ambulatory Visit
Admission: RE | Admit: 2020-06-02 | Discharge: 2020-06-02 | Disposition: A | Discharge status: Home / Self Care | Payer: 59 | Source: Ambulatory Visit | Attending: Gastroenterology | Admitting: Gastroenterology

## 2020-06-02 ENCOUNTER — Other Ambulatory Visit: Payer: Self-pay

## 2020-06-02 DIAGNOSIS — Z20822 Contact with and (suspected) exposure to covid-19: Secondary | ICD-10-CM | POA: Diagnosis not present

## 2020-06-02 DIAGNOSIS — Z01812 Encounter for preprocedural laboratory examination: Secondary | ICD-10-CM | POA: Diagnosis not present

## 2020-06-02 LAB — SARS CORONAVIRUS 2 (TAT 6-24 HRS): SARS Coronavirus 2: NEGATIVE

## 2020-06-04 ENCOUNTER — Encounter
Admission: RE | Disposition: A | Discharge status: Home / Self Care | Payer: Self-pay | Source: Home / Self Care | Attending: Gastroenterology

## 2020-06-04 ENCOUNTER — Ambulatory Visit: Payer: 59 | Admitting: Certified Registered Nurse Anesthetist

## 2020-06-04 ENCOUNTER — Encounter: Payer: Self-pay | Admitting: Gastroenterology

## 2020-06-04 ENCOUNTER — Ambulatory Visit
Admission: RE | Admit: 2020-06-04 | Discharge: 2020-06-04 | Disposition: A | Discharge status: Home / Self Care | Payer: 59 | Attending: Gastroenterology | Admitting: Gastroenterology

## 2020-06-04 DIAGNOSIS — R7303 Prediabetes: Secondary | ICD-10-CM | POA: Insufficient documentation

## 2020-06-04 DIAGNOSIS — Z881 Allergy status to other antibiotic agents status: Secondary | ICD-10-CM | POA: Diagnosis not present

## 2020-06-04 DIAGNOSIS — Z882 Allergy status to sulfonamides status: Secondary | ICD-10-CM | POA: Insufficient documentation

## 2020-06-04 DIAGNOSIS — Z1211 Encounter for screening for malignant neoplasm of colon: Secondary | ICD-10-CM | POA: Diagnosis present

## 2020-06-04 DIAGNOSIS — Z833 Family history of diabetes mellitus: Secondary | ICD-10-CM | POA: Diagnosis not present

## 2020-06-04 HISTORY — PX: COLONOSCOPY WITH PROPOFOL: SHX5780

## 2020-06-04 LAB — GLUCOSE, CAPILLARY: Glucose-Capillary: 81 mg/dL (ref 70–99)

## 2020-06-04 SURGERY — COLONOSCOPY WITH PROPOFOL
Anesthesia: General

## 2020-06-04 MED ORDER — SODIUM CHLORIDE 0.9 % IV SOLN: INTRAVENOUS | Status: DC

## 2020-06-04 MED ORDER — GLYCOPYRROLATE 0.2 MG/ML IJ SOLN
INTRAMUSCULAR | Status: AC
  Filled 2020-06-04: qty 1

## 2020-06-04 MED ORDER — PROPOFOL 10 MG/ML IV BOLUS
INTRAVENOUS | Status: DC | PRN
  Administered 2020-06-04: 80 mg via INTRAVENOUS

## 2020-06-04 MED ORDER — PROPOFOL 500 MG/50ML IV EMUL
INTRAVENOUS | Status: DC | PRN
  Administered 2020-06-04: 160 ug/kg/min via INTRAVENOUS

## 2020-06-04 MED ORDER — PROPOFOL 500 MG/50ML IV EMUL
INTRAVENOUS | Status: AC
  Filled 2020-06-04: qty 200

## 2020-06-04 MED ORDER — LIDOCAINE HCL (PF) 2 % IJ SOLN
INTRAMUSCULAR | Status: AC
  Filled 2020-06-04: qty 5

## 2020-06-04 NOTE — H&P (Signed)
Arlyss Repress, MD [address]  [address]  [place], [place] [address]  Main: [phone]  Fax: [phone] Pager: [phone]  Primary Care Physician:  Duanne Limerick, MD Primary Gastroenterologist:  Dr. Arlyss Repress  Pre-Procedure History & Physical: HPI:  Richard Frost is a 49 y.o. male is here for an colonoscopy.   Past Medical History:  Diagnosis Date  . Allergic rhinitis 08/08/2015  . Allergy   . Anxiety   . Biliary colic   . Depression   . Fatty liver disease, nonalcoholic 08/19/2015  . Gallstones 08/19/2015  . History of kidney stones   . Nephrolithiasis 08/08/2015  . Obesity, Class II, BMI 35-39.9, with comorbidity 08/08/2015  . OCD (obsessive compulsive disorder)   . OSA treated with BiPAP 08/08/2015  . Prediabetes 08/08/2015  . Sleep apnea    cpap  . Vitamin D deficiency 08/11/2015    Past Surgical History:  Procedure Laterality Date  . CHOLECYSTECTOMY N/A 04/05/2017   Procedure: LAPAROSCOPIC CHOLECYSTECTOMY WITH INTRAOPERATIVE CHOLANGIOGRAM;  Surgeon: Lattie Haw, MD;  Location: [facility] ORS;  Service: General;  Laterality: N/A;  . INTERNAL URETHROTOMY     remove kidney stone  . LITHOTRIPSY    . LITHOTRIPSY     x 7    Prior to Admission medications   Medication Sig Start Date End Date Taking? Authorizing Provider  ARIPiprazole (ABILIFY) 15 MG tablet Take 15 mg by mouth at bedtime. CBC   Yes [provider]  atorvastatin (LIPITOR) 10 MG tablet Take 1 tablet (10 mg total) by mouth daily. 05/07/20  Yes Duanne Limerick, MD  cetirizine (ZYRTEC) 10 MG tablet Take 10 mg by mouth at bedtime. otc   Yes [provider]  clonazePAM (KLONOPIN) 2 MG tablet Take 1 tablet by mouth at bedtime. cbc 10/22/15  Yes [provider]  fluvoxaMINE (LUVOX) 100 MG tablet Take 100 mg by mouth 2 (two) times daily. cbc   Yes [provider]  Omega 3 1000 MG CAPS Take 1 capsule by mouth 2 (two) times daily.    Yes [provider]  Cholecalciferol 50 MCG (2000 UT) CAPS Take by mouth. Patient not taking: Reported on 06/04/2020    [provider]  HYDROcodone-acetaminophen (NORCO/VICODIN) 5-325 MG tablet Take 1 tablet by mouth 5 (five) times daily as needed. Patient not taking: Reported on 06/04/2020 11/22/19   [provider]  [facility] VERIO test strip TEST BLOOD SUGAR ONCE DAILY 01/06/20   Duanne Limerick, MD    Allergies as of 05/14/2020 - Review Complete 05/14/2020  Allergen Reaction Noted  . Septra [sulfamethoxazole-trimethoprim] Hives 12/12/2014  . Sulfa antibiotics Hives 08/08/2015    Family History  Problem Relation Age of Onset  . Osteoarthritis Mother   . Hyperlipidemia Father   . Osteoarthritis Father   . Hypertension Father   . Hypertension Sister   . Cancer Maternal Aunt   . Diabetes Paternal Aunt   . Diabetes Paternal Grandmother     Social History   Socioeconomic History  . Marital status: Married    Spouse name: Not on file  . Number of children: Not on file  . Years of education: Not on file  . Highest education level: Not on file  Occupational History  . Not on file  Tobacco Use  . Smoking status: Never Smoker  . Smokeless tobacco: Never Used  Vaping Use  . Vaping Use: Never used  Substance and Sexual Activity  . Alcohol use: Yes  Alcohol/week: 0.0 standard drinks    Comment: 1 drink per month   . Drug use: No  . Sexual activity: Not Currently  Other Topics Concern  . Not on file  Social History Narrative   ** Merged History Encounter **       Social Determinants of Health   Financial Resource Strain:   . Difficulty of Paying Living Expenses: Not on file  Food Insecurity:   . Worried About Programme researcher, broadcasting/film/video in the Last Year: Not on file  . Ran Out of Food in the Last Year: Not on file  Transportation Needs:   . Lack of Transportation (Medical): Not on file  . Lack of Transportation (Non-Medical): Not on file  Physical Activity:     . Days of Exercise per Week: Not on file  . Minutes of Exercise per Session: Not on file  Stress:   . Feeling of Stress : Not on file  Social Connections:   . Frequency of Communication with Friends and Family: Not on file  . Frequency of Social Gatherings with Friends and Family: Not on file  . Attends Religious Services: Not on file  . Active Member of Clubs or Organizations: Not on file  . Attends Banker Meetings: Not on file  . Marital Status: Not on file  Intimate Partner Violence:   . Fear of Current or Ex-Partner: Not on file  . Emotionally Abused: Not on file  . Physically Abused: Not on file  . Sexually Abused: Not on file    Review of Systems: See HPI, otherwise negative ROS  Physical Exam: BP 134/81   Pulse 97   Temp (!) 96.4 F (35.8 C) (Temporal)   Resp 18   Ht 5\' 10"  (1.778 m)   Wt 123.4 kg   SpO2 97%   BMI 39.03 kg/m  General:   Alert,  pleasant and cooperative in NAD Head:  Normocephalic and atraumatic. Neck:  Supple; no masses or thyromegaly. Lungs:  Clear throughout to auscultation.    Heart:  Regular rate and rhythm. Abdomen:  Soft, nontender and nondistended. Normal bowel sounds, without guarding, and without rebound.   Neurologic:  Alert and  oriented x4;  grossly normal neurologically.  Impression/Plan: Richard Frost is here for an colonoscopy to be performed for colon cancer screening  Risks, benefits, limitations, and alternatives regarding  colonoscopy have been reviewed with the patient.  Questions have been answered.  All parties agreeable.   Annell Greening, MD  06/04/2020, 8:18 AM

## 2020-06-04 NOTE — Anesthesia Preprocedure Evaluation (Signed)
Anesthesia Evaluation  Patient identified by MRN, date of birth, ID band Patient awake    Reviewed: Allergy & Precautions, NPO status , Patient's Chart, lab work & pertinent test results  History of Anesthesia Complications Negative for: history of anesthetic complications  Airway Mallampati: III  TM Distance: >3 FB Neck ROM: Full    Dental no notable dental hx.    Pulmonary sleep apnea (biPAP) , neg COPD,    breath sounds clear to auscultation- rhonchi (-) wheezing      Cardiovascular Exercise Tolerance: Good (-) hypertension(-) CAD, (-) Past MI, (-) Cardiac Stents and (-) CABG  Rhythm:Regular Rate:Normal - Systolic murmurs and - Diastolic murmurs    Neuro/Psych neg Seizures PSYCHIATRIC DISORDERS Anxiety Depression negative neurological ROS     GI/Hepatic negative GI ROS, Neg liver ROS,   Endo/Other  diabetes (diet controlled)  Renal/GU Renal disease: hx of nephrolithiasis.     Musculoskeletal negative musculoskeletal ROS (+)   Abdominal (+) + obese,   Peds  Hematology negative hematology ROS (+)   Anesthesia Other Findings Past Medical History: 08/08/2015: Allergic rhinitis No date: Allergy No date: Anxiety No date: Biliary colic No date: Depression 08/19/2015: Fatty liver disease, nonalcoholic 08/19/2015: Gallstones No date: History of kidney stones 08/08/2015: Nephrolithiasis 08/08/2015: Obesity, Class II, BMI 35-39.9, with comorbidity No date: OCD (obsessive compulsive disorder) 08/08/2015: OSA treated with BiPAP 08/08/2015: Prediabetes No date: Sleep apnea     Comment:  cpap 08/11/2015: Vitamin D deficiency   Reproductive/Obstetrics                             Anesthesia Physical Anesthesia Plan  ASA: III  Anesthesia Plan: General   Post-op Pain Management:    Induction: Intravenous  PONV Risk Score and Plan: 1 and Propofol infusion  Airway Management Planned:  Natural Airway  Additional Equipment:   Intra-op Plan:   Post-operative Plan:   Informed Consent: I have reviewed the patients History and Physical, chart, labs and discussed the procedure including the risks, benefits and alternatives for the proposed anesthesia with the patient or authorized representative who has indicated his/her understanding and acceptance.     Dental advisory given  Plan Discussed with: CRNA and Anesthesiologist  Anesthesia Plan Comments:         Anesthesia Quick Evaluation

## 2020-06-04 NOTE — Transfer of Care (Signed)
Immediate Anesthesia Transfer of Care Note  Patient: Richard Frost  Procedure(s) Performed: COLONOSCOPY WITH PROPOFOL (N/A )  Patient Location: PACU  Anesthesia Type:General  Level of Consciousness: drowsy  Airway & Oxygen Therapy: Patient Spontanous Breathing and Patient connected to nasal cannula oxygen  Post-op Assessment: Report given to RN and Post -op Vital signs reviewed and stable  Post vital signs: Reviewed and stable  Last Vitals:  Vitals Value Taken Time  BP 96/61 06/04/20 0847  Temp    Pulse 68 06/04/20 0847  Resp 10 06/04/20 0847  SpO2 95 % 06/04/20 0847  Vitals shown include unvalidated device data.  Last Pain:  Vitals:   06/04/20 0731  TempSrc: Temporal  PainSc: 0-No pain         Complications: No complications documented.

## 2020-06-04 NOTE — Anesthesia Postprocedure Evaluation (Signed)
Anesthesia Post Note  Patient: Richard Frost  Procedure(s) Performed: COLONOSCOPY WITH PROPOFOL (N/A )  Patient location during evaluation: Endoscopy Anesthesia Type: General Level of consciousness: awake and alert and oriented Pain management: pain level controlled Vital Signs Assessment: post-procedure vital signs reviewed and stable Respiratory status: spontaneous breathing, nonlabored ventilation and respiratory function stable Cardiovascular status: blood pressure returned to baseline and stable Postop Assessment: no signs of nausea or vomiting Anesthetic complications: no   No complications documented.   Last Vitals:  Vitals:   06/04/20 0731 06/04/20 0847  BP: 134/81 96/61  Pulse: 97 68  Resp: 18 10  Temp: (!) 35.8 C 36.7 C  SpO2: 97% 95%    Last Pain:  Vitals:   06/04/20 0907  TempSrc:   PainSc: 0-No pain                 Christinamarie Tall

## 2020-06-04 NOTE — Op Note (Signed)
[facility] Gastroenterology Patient Name: Augustine Leverette Procedure Date: 06/04/2020 7:42 AM MRN: [id number] Account #: [id number] Date of Birth: 10/24/1970 Admit Type: Outpatient Age: 49 Room: [facility] ENDO ROOM 4 Gender: Male Note Status: Finalized Procedure:             Colonoscopy Indications:           Screening for colorectal malignant neoplasm, This is                         the patient's first colonoscopy Providers:             Toney Reil MD, MD Medicines:             General Anesthesia Complications:         No immediate complications. Estimated blood loss: None. Procedure:             Pre-Anesthesia Assessment:                        - Prior to the procedure, a History and Physical was                         performed, and patient medications and allergies were                         reviewed. The patient is competent. The risks and                         benefits of the procedure and the sedation options and                         risks were discussed with the patient. All questions                         were answered and informed consent was obtained.                         Patient identification and proposed procedure were                         verified by the physician, the nurse, the                         anesthesiologist, the anesthetist and the technician                         in the pre-procedure area in the procedure room in the                         endoscopy suite. Mental Status Examination: alert and                         oriented. Airway Examination: normal oropharyngeal                         airway and neck mobility. Respiratory Examination:                         clear to auscultation. CV Examination: normal.  Prophylactic Antibiotics: The patient does not require                         prophylactic antibiotics. Prior Anticoagulants: The                         patient has taken no previous  anticoagulant or                         antiplatelet agents. ASA Grade Assessment: III - A                         patient with severe systemic disease. After reviewing                         the risks and benefits, the patient was deemed in                         satisfactory condition to undergo the procedure. The                         anesthesia plan was to use general anesthesia.                         Immediately prior to administration of medications,                         the patient was re-assessed for adequacy to receive                         sedatives. The heart rate, respiratory rate, oxygen                         saturations, blood pressure, adequacy of pulmonary                         ventilation, and response to care were monitored                         throughout the procedure. The physical status of the                         patient was re-assessed after the procedure.                        After obtaining informed consent, the colonoscope was                         passed under direct vision. Throughout the procedure,                         the patient's blood pressure, pulse, and oxygen                         saturations were monitored continuously. The                         Colonoscope was introduced through the anus and  advanced to the the cecum, identified by appendiceal                         orifice and ileocecal valve. The colonoscopy was                         performed with moderate difficulty due to significant                         looping and the patient's body habitus. Successful                         completion of the procedure was aided by applying                         abdominal pressure. The patient tolerated the                         procedure well. The quality of the bowel preparation                         was evaluated using the BBPS [facility] Bowel Preparation                         Scale) with  scores of: Right Colon = 2 (minor amount                         of residual staining, small fragments of stool and/or                         opaque liquid, but mucosa seen well), Transverse Colon                         = 2 (minor amount of residual staining, small                         fragments of stool and/or opaque liquid, but mucosa                         seen well) and Left Colon = 2 (minor amount of                         residual staining, small fragments of stool and/or                         opaque liquid, but mucosa seen well). The total BBPS                         score equals 6. Findings:      The perianal and digital rectal examinations were normal. Pertinent       negatives include normal sphincter tone and no palpable rectal lesions.      A large amount of liquid stool was found in the entire colon, precluding       visualization. Lavage of the area was performed using 50 - 200 mL of       sterile water, resulting in clearance with adequate visualization.  The entire examined colon appeared normal.      The retroflexed view of the distal rectum and anal verge was normal and       showed no anal or rectal abnormalities. Impression:            - Stool in the entire examined colon.                        - The entire examined colon is normal.                        - The distal rectum and anal verge are normal on                         retroflexion view.                        - No specimens collected. Recommendation:        - Discharge patient to home (with escort).                        - Diabetic (ADA) diet today.                        - Continue present medications.                        - Repeat colonoscopy in 7 years for screening purposes. Procedure Code(s):     --- Professional ---                        Y1017, Colorectal cancer screening; colonoscopy on                         individual not meeting criteria for high risk Diagnosis Code(s):     ---  Professional ---                        Z12.11, Encounter for screening for malignant neoplasm                         of colon CPT copyright 2019 [employer]. All rights reserved. The codes documented in this report are preliminary and upon coder review may  be revised to meet current compliance requirements. Dr. Libby Maw Toney Reil MD, MD 06/04/2020 8:45:41 AM This report has been signed electronically. Number of Addenda: 0 Note Initiated On: 06/04/2020 7:42 AM Scope Withdrawal Time: 0 hours 9 minutes 39 seconds  Total Procedure Duration: 0 hours 17 minutes 40 seconds  Estimated Blood Loss:  Estimated blood loss: none.      [facility]

## 2020-06-04 NOTE — Anesthesia Procedure Notes (Signed)
Performed by: Malva Cogan, CRNA Pre-anesthesia Checklist: Patient identified, Suction available, Patient being monitored, Emergency Drugs available and Timeout performed Patient Re-evaluated:Patient Re-evaluated prior to induction Oxygen Delivery Method: Nasal cannula Induction Type: IV induction Placement Confirmation: CO2 detector

## 2020-06-05 ENCOUNTER — Encounter: Payer: Self-pay | Admitting: Gastroenterology

## 2020-10-24 ENCOUNTER — Other Ambulatory Visit: Payer: Self-pay | Admitting: Family Medicine

## 2020-10-24 ENCOUNTER — Encounter: Payer: Self-pay | Admitting: Family Medicine

## 2020-10-24 ENCOUNTER — Other Ambulatory Visit: Payer: Self-pay

## 2020-10-24 ENCOUNTER — Ambulatory Visit: Payer: 59 | Admitting: Family Medicine

## 2020-10-24 VITALS — BP 110/68 | HR 88 | Ht 70.0 in | Wt 263.0 lb

## 2020-10-24 DIAGNOSIS — R69 Illness, unspecified: Secondary | ICD-10-CM | POA: Diagnosis not present

## 2020-10-24 DIAGNOSIS — R7303 Prediabetes: Secondary | ICD-10-CM | POA: Diagnosis not present

## 2020-10-24 DIAGNOSIS — E782 Mixed hyperlipidemia: Secondary | ICD-10-CM | POA: Diagnosis not present

## 2020-10-24 NOTE — Progress Notes (Signed)
Date:  10/24/2020   Name:  Richard Frost   DOB:  07-15-1971   MRN:  [id number]   Chief Complaint: Prediabetes (Needs labs)  Diabetes He presents for his follow-up diabetic visit. He has type 2 diabetes mellitus. His disease course has been stable. There are no hypoglycemic associated symptoms. Pertinent negatives for hypoglycemia include no dizziness, headaches or nervousness/anxiousness. There are no diabetic associated symptoms. Pertinent negatives for diabetes include no chest pain and no polydipsia. There are no hypoglycemic complications. Symptoms are stable. There are no diabetic complications. Risk factors for coronary artery disease include diabetes mellitus and hypertension. Current diabetic treatment includes diet. He is compliant with treatment all of the time. His weight is stable. He is following a generally healthy diet. Meal planning includes avoidance of concentrated sweets and carbohydrate counting. He participates in exercise daily. His home blood glucose trend is fluctuating minimally. His breakfast blood glucose is taken between 8-9 am. His breakfast blood glucose range is generally 110-130 mg/dl. An ACE inhibitor/angiotensin II receptor blocker is being taken.  Hyperlipidemia This is a chronic problem. The current episode started more than 1 year ago. The problem is controlled. Recent lipid tests were reviewed and are normal. Factors aggravating his hyperlipidemia include thiazides. Pertinent negatives include no chest pain, myalgias or shortness of breath. Current antihyperlipidemic treatment includes statins. The current treatment provides moderate improvement of lipids. There are no compliance problems.  Risk factors for coronary artery disease include dyslipidemia.    Lab Results  Component Value Date   CREATININE 1.23 05/07/2020   BUN 12 05/07/2020   NA 144 05/07/2020   K 4.4 05/07/2020   CL 103 05/07/2020   CO2 24 05/07/2020   Lab Results  Component Value  Date   CHOL 181 05/07/2020   HDL 59 05/07/2020   LDLCALC 98 05/07/2020   TRIG 137 05/07/2020   CHOLHDL 4.0 11/20/2018   Lab Results  Component Value Date   TSH 2.150 08/08/2015   Lab Results  Component Value Date   HGBA1C 6.2 (H) 05/07/2020   Lab Results  Component Value Date   WBC 6.7 04/04/2017   HGB 14.1 04/04/2017   HCT 42.9 04/04/2017   MCV 90.9 04/04/2017   PLT 178 04/04/2017   Lab Results  Component Value Date   ALT 42 05/07/2020   AST 34 05/07/2020   ALKPHOS 108 05/07/2020   BILITOT 0.9 05/07/2020     Review of Systems  Constitutional: Negative for chills and fever.  HENT: Negative for drooling, ear discharge, ear pain and sore throat.   Respiratory: Negative for cough, shortness of breath and wheezing.   Cardiovascular: Negative for chest pain, palpitations and leg swelling.  Gastrointestinal: Negative for abdominal pain, blood in stool, constipation, diarrhea and nausea.  Endocrine: Negative for polydipsia.  Genitourinary: Negative for dysuria, frequency, hematuria and urgency.  Musculoskeletal: Negative for back pain, myalgias and neck pain.  Skin: Negative for rash.  Allergic/Immunologic: Negative for environmental allergies.  Neurological: Negative for dizziness and headaches.  Hematological: Does not bruise/bleed easily.  Psychiatric/Behavioral: Negative for suicidal ideas. The patient is not nervous/anxious.     Patient Active Problem List   Diagnosis Date Noted  . Colon cancer screening 05/14/2020  . Biliary colic   . Gallstones 08/19/2015  . Fatty liver disease, nonalcoholic 08/19/2015  . Vitamin D deficiency 08/11/2015  . OCD (obsessive compulsive disorder) 08/08/2015  . Allergic rhinitis 08/08/2015  . Nephrolithiasis 08/08/2015  . Prediabetes 08/08/2015  . OSA treated  with BiPAP 08/08/2015  . Obesity, Class II, BMI 35-39.9, with comorbidity 08/08/2015    Allergies  Allergen Reactions  . Septra [Sulfamethoxazole-Trimethoprim] Hives   . Sulfa Antibiotics Hives    Past Surgical History:  Procedure Laterality Date  . CHOLECYSTECTOMY N/A 04/05/2017   Procedure: LAPAROSCOPIC CHOLECYSTECTOMY WITH INTRAOPERATIVE CHOLANGIOGRAM;  Surgeon: Lattie Haw, MD;  Location: [facility] ORS;  Service: General;  Laterality: N/A;  . COLONOSCOPY WITH PROPOFOL N/A 06/04/2020   Procedure: COLONOSCOPY WITH PROPOFOL;  Surgeon: Toney Reil, MD;  Location: [facility] ENDOSCOPY;  Service: Gastroenterology;  Laterality: N/A;  . INTERNAL URETHROTOMY     remove kidney stone  . LITHOTRIPSY    . LITHOTRIPSY     x 7    Social History   Tobacco Use  . Smoking status: Never Smoker  . Smokeless tobacco: Never Used  Vaping Use  . Vaping Use: Never used  Substance Use Topics  . Alcohol use: Yes    Alcohol/week: 0.0 standard drinks    Comment: 1 drink per month   . Drug use: No     Medication list has been reviewed and updated.  Current Meds  Medication Sig  . ARIPiprazole (ABILIFY) 15 MG tablet Take 15 mg by mouth at bedtime. CBC  . atorvastatin (LIPITOR) 10 MG tablet Take 1 tablet (10 mg total) by mouth daily.  . cetirizine (ZYRTEC) 10 MG tablet Take 10 mg by mouth at bedtime. otc  . clonazePAM (KLONOPIN) 2 MG tablet Take 1 tablet by mouth at bedtime. cbc  . fluvoxaMINE (LUVOX) 100 MG tablet Take 100 mg by mouth 2 (two) times daily. cbc  . ONETOUCH VERIO test strip TEST BLOOD SUGAR ONCE DAILY    PHQ 2/9 Scores 05/07/2020 06/21/2019 11/20/2018 03/03/2018  PHQ - 2 Score 0 0 0 0  PHQ- 9 Score 0 0 0 -    GAD 7 : Generalized Anxiety Score 05/07/2020 06/21/2019  Nervous, Anxious, on Edge 0 0  Control/stop worrying 0 0  Worry too much - different things 0 0  Trouble relaxing 0 0  Restless 0 0  Easily annoyed or irritable 0 0  Afraid - awful might happen 0 0  Total GAD 7 Score 0 0    BP Readings from Last 3 Encounters:  10/24/20 110/68  06/04/20 117/79  05/07/20 120/70    Physical Exam Vitals and nursing note reviewed.  HENT:      Head: Normocephalic.     Right Ear: Tympanic membrane and external ear normal. There is no impacted cerumen.     Left Ear: Tympanic membrane, ear canal and external ear normal. There is no impacted cerumen.     Nose: Nose normal. No congestion or rhinorrhea.     Mouth/Throat:     Mouth: Mucous membranes are moist.  Eyes:     General: No scleral icterus.       Right eye: No discharge.        Left eye: No discharge.     Conjunctiva/sclera: Conjunctivae normal.     Pupils: Pupils are equal, round, and reactive to light.  Neck:     Thyroid: No thyromegaly.     Vascular: No JVD.     Trachea: No tracheal deviation.  Cardiovascular:     Rate and Rhythm: Normal rate and regular rhythm.     Heart sounds: Normal heart sounds. No murmur heard. No friction rub. No gallop.   Pulmonary:     Effort: No respiratory distress.     Breath  sounds: Normal breath sounds. No wheezing, rhonchi or rales.  Abdominal:     General: Bowel sounds are normal.     Palpations: Abdomen is soft. There is no mass.     Tenderness: There is no abdominal tenderness. There is no guarding or rebound.  Musculoskeletal:        General: No tenderness. Normal range of motion.     Cervical back: Normal range of motion and neck supple.  Lymphadenopathy:     Cervical: No cervical adenopathy.  Skin:    General: Skin is warm.     Findings: No rash.  Neurological:     Mental Status: He is alert and oriented to person, place, and time.     Cranial Nerves: No cranial nerve deficit.     Deep Tendon Reflexes: Reflexes are normal and symmetric.     Wt Readings from Last 3 Encounters:  10/24/20 263 lb (119.3 kg)  06/04/20 272 lb (123.4 kg)  05/07/20 271 lb (122.9 kg)    BP 110/68   Pulse 88   Ht 5\' 10"  (1.778 m)   Wt 263 lb (119.3 kg)   BMI 37.74 kg/m    Assessment and Plan: 1. Prediabetes Chronic.  Controlled.  Stable.  We will check an A1c with complete metabolic panel for current level electrolytes and  GFR. - HgB A1c - COMPLETE METABOLIC PANEL WITH GFR  2. Mixed hyperlipidemia Chronic.  Controlled.  Stable.  Patient is on omega-3 and diet controlled will check lipid panel at this time. - Lipid Panel With LDL/HDL Ratio  3. Taking medication for chronic disease Patient is on multiple medications per CVC/psychiatry.  We will check a CMP for hepatic toxicity. - COMPLETE METABOLIC PANEL WITH GFR

## 2020-10-25 ENCOUNTER — Encounter: Payer: Self-pay | Admitting: Family Medicine

## 2020-10-25 LAB — CMP14+EGFR
ALT: 38 IU/L (ref 0–44)
AST: 31 IU/L (ref 0–40)
Albumin/Globulin Ratio: 2 (ref 1.2–2.2)
Albumin: 4.8 g/dL (ref 3.8–4.9)
Alkaline Phosphatase: 116 IU/L (ref 44–121)
BUN/Creatinine Ratio: 10 (ref 9–20)
BUN: 11 mg/dL (ref 6–24)
Bilirubin Total: 0.9 mg/dL (ref 0.0–1.2)
CO2: 24 mmol/L (ref 20–29)
Calcium: 9.5 mg/dL (ref 8.7–10.2)
Chloride: 102 mmol/L (ref 96–106)
Creatinine, Ser: 1.13 mg/dL (ref 0.76–1.27)
Globulin, Total: 2.4 g/dL (ref 1.5–4.5)
Glucose: 86 mg/dL (ref 65–99)
Potassium: 4.1 mmol/L (ref 3.5–5.2)
Sodium: 142 mmol/L (ref 134–144)
Total Protein: 7.2 g/dL (ref 6.0–8.5)
eGFR: 79 mL/min/{1.73_m2} (ref >59)

## 2020-10-25 LAB — LIPID PANEL WITH LDL/HDL RATIO
Cholesterol, Total: 139 mg/dL (ref 100–199)
HDL: 53 mg/dL (ref >39)
LDL Chol Calc (NIH): 70 mg/dL (ref 0–99)
LDL/HDL Ratio: 1.3 ratio (ref 0.0–3.6)
Triglycerides: 81 mg/dL (ref 0–149)
VLDL Cholesterol Cal: 16 mg/dL (ref 5–40)

## 2020-10-25 LAB — HEMOGLOBIN A1C
Est. average glucose Bld gHb Est-mCnc: 123 mg/dL
Hgb A1c MFr Bld: 5.9 % — ABNORMAL HIGH (ref 4.8–5.6)

## 2020-11-06 ENCOUNTER — Other Ambulatory Visit (HOSPITAL_COMMUNITY): Payer: Self-pay

## 2021-01-25 ENCOUNTER — Encounter: Payer: Self-pay | Admitting: Family Medicine

## 2021-03-12 ENCOUNTER — Other Ambulatory Visit: Payer: Self-pay | Admitting: Family Medicine

## 2021-03-12 DIAGNOSIS — E782 Mixed hyperlipidemia: Secondary | ICD-10-CM

## 2021-05-08 ENCOUNTER — Encounter: Payer: Self-pay | Admitting: Family Medicine

## 2021-05-08 ENCOUNTER — Ambulatory Visit (INDEPENDENT_AMBULATORY_CARE_PROVIDER_SITE_OTHER): Payer: 59 | Admitting: Family Medicine

## 2021-05-08 ENCOUNTER — Other Ambulatory Visit: Payer: Self-pay

## 2021-05-08 VITALS — BP 120/70 | HR 80 | Ht 70.0 in | Wt 272.0 lb

## 2021-05-08 DIAGNOSIS — E782 Mixed hyperlipidemia: Secondary | ICD-10-CM

## 2021-05-08 DIAGNOSIS — Z0000 Encounter for general adult medical examination without abnormal findings: Secondary | ICD-10-CM

## 2021-05-08 DIAGNOSIS — Z6839 Body mass index (BMI) 39.0-39.9, adult: Secondary | ICD-10-CM

## 2021-05-08 DIAGNOSIS — R7303 Prediabetes: Secondary | ICD-10-CM | POA: Diagnosis not present

## 2021-05-08 DIAGNOSIS — Z23 Encounter for immunization: Secondary | ICD-10-CM | POA: Diagnosis not present

## 2021-05-08 LAB — HEMOCCULT GUIAC POC 1CARD (OFFICE): Fecal Occult Blood, POC: NEGATIVE

## 2021-05-08 MED ORDER — ATORVASTATIN CALCIUM 10 MG PO TABS: 10.0000 mg | ORAL_TABLET | Freq: Every day | ORAL | 1 refills | Status: DC

## 2021-05-08 MED ORDER — OMEGA 3 1000 MG PO CAPS: 1.0000 | ORAL_CAPSULE | Freq: Two times a day (BID) | ORAL | 5 refills | Status: AC

## 2021-05-08 NOTE — Patient Instructions (Signed)

## 2021-05-08 NOTE — Progress Notes (Signed)
Date:  05/08/2021   Name:  Richard Frost   DOB:  1971-07-08   MRN:  [id number]   Chief Complaint: Prediabetes, Flu Vaccine, and Annual Exam  Richard Frost is a 50 y.o. male who presents today for his Complete Annual Exam. He feels well. He reports exercising "not a lot". He reports he is sleeping well.     Lab Results  Component Value Date   CREATININE 1.13 10/24/2020   BUN 11 10/24/2020   NA 142 10/24/2020   K 4.1 10/24/2020   CL 102 10/24/2020   CO2 24 10/24/2020   Lab Results  Component Value Date   CHOL 139 10/24/2020   HDL 53 10/24/2020   LDLCALC 70 10/24/2020   TRIG 81 10/24/2020   CHOLHDL 4.0 11/20/2018   Lab Results  Component Value Date   TSH 2.150 08/08/2015   Lab Results  Component Value Date   HGBA1C 5.9 (H) 10/24/2020   Lab Results  Component Value Date   WBC 6.7 04/04/2017   HGB 14.1 04/04/2017   HCT 42.9 04/04/2017   MCV 90.9 04/04/2017   PLT 178 04/04/2017   Lab Results  Component Value Date   ALT 38 10/24/2020   AST 31 10/24/2020   ALKPHOS 116 10/24/2020   BILITOT 0.9 10/24/2020     Review of Systems  Constitutional:  Negative for chills and fever.  HENT:  Negative for drooling, ear discharge, ear pain and sore throat.   Respiratory:  Negative for cough, shortness of breath and wheezing.   Cardiovascular:  Negative for chest pain, palpitations and leg swelling.  Gastrointestinal:  Negative for abdominal pain, blood in stool, constipation, diarrhea and nausea.  Endocrine: Negative for polydipsia.  Genitourinary:  Negative for dysuria, frequency, hematuria and urgency.  Musculoskeletal:  Negative for back pain, myalgias and neck pain.  Skin:  Negative for rash.  Allergic/Immunologic: Negative for environmental allergies.  Neurological:  Negative for dizziness and headaches.  Hematological:  Does not bruise/bleed easily.  Psychiatric/Behavioral:  Negative for suicidal ideas. The patient is not nervous/anxious.     Patient Active Problem List   Diagnosis Date Noted   Colon cancer screening 05/14/2020   Biliary colic    Gallstones 08/19/2015   Fatty liver disease, nonalcoholic 08/19/2015   Vitamin D deficiency 08/11/2015   OCD (obsessive compulsive disorder) 08/08/2015   Allergic rhinitis 08/08/2015   Nephrolithiasis 08/08/2015   Prediabetes 08/08/2015   OSA treated with BiPAP 08/08/2015   Obesity, Class II, BMI 35-39.9, with comorbidity 08/08/2015    Allergies  Allergen Reactions   Septra [Sulfamethoxazole-Trimethoprim] Hives   Sulfa Antibiotics Hives    Past Surgical History:  Procedure Laterality Date   CHOLECYSTECTOMY N/A 04/05/2017   Procedure: LAPAROSCOPIC CHOLECYSTECTOMY WITH INTRAOPERATIVE CHOLANGIOGRAM;  Surgeon: Lattie Haw, MD;  Location: [facility];  Service: General;  Laterality: N/A;   COLONOSCOPY WITH PROPOFOL N/A 06/04/2020   Procedure: COLONOSCOPY WITH PROPOFOL;  Surgeon: Toney Reil, MD;  Location: [facility];  Service: Gastroenterology;  Laterality: N/A;   INTERNAL URETHROTOMY     remove kidney stone   LITHOTRIPSY     LITHOTRIPSY     x 7    Social History   Tobacco Use   Smoking status: Never   Smokeless tobacco: Never  Vaping Use   Vaping Use: Never used  Substance Use Topics   Alcohol use: Yes    Alcohol/week: 0.0 standard drinks    Comment: 1 drink per month    Drug  use: No     Medication list has been reviewed and updated.  Current Meds  Medication Sig   ARIPiprazole (ABILIFY) 15 MG tablet Take 15 mg by mouth at bedtime. CBC   atorvastatin (LIPITOR) 10 MG tablet TAKE 1 TABLET BY MOUTH ONCE DAILY   cetirizine (ZYRTEC) 10 MG tablet Take 10 mg by mouth at bedtime. otc   clonazePAM (KLONOPIN) 2 MG tablet Take 1 tablet by mouth at bedtime. cbc   fluvoxaMINE (LUVOX) 100 MG tablet Take 100 mg by mouth 2 (two) times daily. cbc   Omega 3 1000 MG CAPS Take 1 capsule by mouth 2 (two) times daily.   ONETOUCH VERIO test strip TEST BLOOD  SUGAR ONCE DAILY    PHQ 2/9 Scores 05/08/2021 05/07/2020 06/21/2019 11/20/2018  PHQ - 2 Score 0 0 0 0  PHQ- 9 Score 0 0 0 0    GAD 7 : Generalized Anxiety Score 05/08/2021 05/07/2020 06/21/2019  Nervous, Anxious, on Edge 0 0 0  Control/stop worrying 0 0 0  Worry too much - different things 0 0 0  Trouble relaxing 0 0 0  Restless 0 0 0  Easily annoyed or irritable 0 0 0  Afraid - awful might happen 0 0 0  Total GAD 7 Score 0 0 0    BP Readings from Last 3 Encounters:  05/08/21 120/70  10/24/20 110/68  06/04/20 117/79    Physical Exam Vitals and nursing note reviewed.  Constitutional:      Appearance: Normal appearance. He is overweight.  HENT:     Head: Normocephalic.     Jaw: There is normal jaw occlusion.     Right Ear: Hearing, tympanic membrane, ear canal and external ear normal.     Left Ear: Hearing, tympanic membrane, ear canal and external ear normal.     Nose: Nose normal. No congestion or rhinorrhea.     Mouth/Throat:     Lips: Pink.     Mouth: Mucous membranes are moist.     Dentition: Normal dentition.     Tongue: No lesions.     Palate: No mass.     Pharynx: Oropharynx is clear. Uvula midline.  Eyes:     General: Lids are normal. Vision grossly intact. Gaze aligned appropriately. No scleral icterus.       Right eye: No discharge.        Left eye: No discharge.     Extraocular Movements: Extraocular movements intact.     Conjunctiva/sclera: Conjunctivae normal.     Pupils: Pupils are equal, round, and reactive to light.     Funduscopic exam:    Right eye: Red reflex present.        Left eye: Red reflex present. Neck:     Thyroid: No thyroid mass, thyromegaly or thyroid tenderness.     Vascular: Normal carotid pulses. No carotid bruit, hepatojugular reflux or JVD.     Trachea: Trachea and phonation normal. No tracheal tenderness, tracheostomy, abnormal tracheal secretions or tracheal deviation.  Cardiovascular:     Rate and Rhythm: Normal rate and regular  rhythm.     Chest Wall: PMI is not displaced.     Pulses: Normal pulses.          Carotid pulses are 2+ on the right side and 2+ on the left side.      Radial pulses are 2+ on the right side and 2+ on the left side.       Femoral pulses are 2+ on  the right side and 2+ on the left side.      Popliteal pulses are 2+ on the right side and 2+ on the left side.       Dorsalis pedis pulses are 2+ on the right side and 2+ on the left side.       Posterior tibial pulses are 2+ on the right side and 2+ on the left side.     Heart sounds: Normal heart sounds, S1 normal and S2 normal. No murmur heard. No systolic murmur is present.  No diastolic murmur is present.    No friction rub. No gallop. No S3 or S4 sounds.  Pulmonary:     Effort: Pulmonary effort is normal. No respiratory distress.     Breath sounds: Normal breath sounds. No decreased breath sounds, wheezing, rhonchi or rales.  Chest:     Chest wall: No mass.  Breasts:    Breasts are symmetrical.     Right: Normal.     Left: Normal.  Abdominal:     General: Bowel sounds are normal.     Palpations: Abdomen is soft. There is no hepatomegaly, splenomegaly or mass.     Tenderness: There is no abdominal tenderness. There is no guarding or rebound.  Genitourinary:    Testes: Normal.        Right: Mass not present.        Left: Mass not present.  Musculoskeletal:        General: No tenderness. Normal range of motion.     Cervical back: Full passive range of motion without pain, normal range of motion and neck supple.  Lymphadenopathy:     Cervical: No cervical adenopathy.     Right cervical: No superficial, deep or posterior cervical adenopathy.    Left cervical: No superficial, deep or posterior cervical adenopathy.     Upper Body:     Right upper body: No supraclavicular or axillary adenopathy.     Left upper body: No supraclavicular or axillary adenopathy.     Lower Body: No right inguinal adenopathy. No left inguinal adenopathy.   Skin:    General: Skin is warm.     Capillary Refill: Capillary refill takes less than 2 seconds.     Findings: No rash.  Neurological:     Mental Status: He is alert and oriented to person, place, and time.     Cranial Nerves: Cranial nerves are intact. No cranial nerve deficit.     Sensory: Sensation is intact.     Motor: Motor function is intact.     Deep Tendon Reflexes: Reflexes are normal and symmetric.     Reflex Scores:      Tricep reflexes are 2+ on the right side and 2+ on the left side.      Bicep reflexes are 2+ on the right side and 2+ on the left side.      Brachioradialis reflexes are 2+ on the right side and 2+ on the left side.      Patellar reflexes are 2+ on the right side and 2+ on the left side.      Achilles reflexes are 2+ on the right side and 2+ on the left side. Psychiatric:        Behavior: Behavior is cooperative.    Wt Readings from Last 3 Encounters:  05/08/21 272 lb (123.4 kg)  10/24/20 263 lb (119.3 kg)  06/04/20 272 lb (123.4 kg)    BP 120/70   Pulse 80  Ht 5\' 10"  (1.778 m)   Wt 272 lb (123.4 kg)   BMI 39.03 kg/m   Assessment and Plan: Patient is a 50year old male none particular who presents for a comprehensive physical exam. The patient reports the following problems: None. Health maintenance has been reviewed up-to-date.Richard Frost is a 50 y.o. male who presents today for his Complete Annual Exam. He feels well. He reports exercising walking. He reports he is sleeping well.  Patient's chart was reviewed for previous encounters most recent labs most recent care everywhere.  1. Annual physical exam Immunizations are reviewed and recommendations provided.   Age appropriate screening tests are discussed. Counseling given for risk factor reduction interventions.  No subjective/objective concerns noted during history, review of systems and physical exam.  A1c was drawn. - POCT Occult Blood Stool  2. Prediabetes Chronic.  Controlled.   Stable.  Currently controlled with dietary approach elimination of concentrated sweets and limitations of carbohydrates.  Will check A1c and microalbuminuria - HgB A1c - Microalbumin, urine  3. Mixed hyperlipidemia Chronic.  Controlled.  Stable.  Continue atorvastatin 10 mg once a day.  Patient also takes omega-3 over-the-counter not encouraged to continue to do so. - atorvastatin (LIPITOR) 10 MG tablet; Take 1 tablet (10 mg total) by mouth daily.  Dispense: 90 tablet; Refill: 1 - Omega 3 1000 MG CAPS; Take 1 capsule (1,000 mg total) by mouth 2 (two) times daily.  Dispense: 60 capsule; Refill: 5  4. BMI 39.0-39.9,adult Health risks of being over weight were discussed and patient was counseled on weight loss options and exercise.   5. Need for immunization against influenza Discussed and administered - Flu Vaccine QUAD 28mo+IM (Fluarix, Fluzone & Alfiuria Quad PF)

## 2021-05-09 LAB — HEMOGLOBIN A1C
Est. average glucose Bld gHb Est-mCnc: 117 mg/dL
Hgb A1c MFr Bld: 5.7 % — ABNORMAL HIGH (ref 4.8–5.6)

## 2021-05-09 LAB — MICROALBUMIN, URINE: Microalbumin, Urine: 62.2 ug/mL

## 2021-07-13 ENCOUNTER — Telehealth: Payer: Self-pay | Admitting: Family Medicine

## 2021-07-13 ENCOUNTER — Ambulatory Visit: Payer: Self-pay

## 2021-07-13 NOTE — Telephone Encounter (Signed)
Patient called, left VM to return the call to the office to discuss symptoms with a nurse.     Richard Frost 6 hours ago (9:56 AM)   [name] Pt states that he steped on a nail that went through his shoe but does not think it entered his skin at all. He did not see any blood. He wants to know if he should get a  tetanus shot. The nail was not rusty      Note

## 2021-07-13 NOTE — Telephone Encounter (Signed)
Pt called back stating that he is wanting to know if he needs to come in for a tetanus booster. He reports stepping on a nail but his shoe absorbed most of the nail. Denies having puncture wound or bleeding. Cant even tell where nail touched skin. Pt says they had roof replaced couple months ago and the nail wasn't rusty, looked like a new nail. Advised pt that he had Tdap vaccine in 08/2019 so wouldn't be due for another booster until 08/2029. Advised pt that since no skin is broke and no injury noted he should be fine but if he does experience any symptoms in next few days to call back and schedule an appt. Pt verbalized understanding.    Reason for Disposition  Health Information question, no triage required and triager able to answer question  Answer Assessment - Initial Assessment Questions 1. REASON FOR CALL or QUESTION: "What is your reason for calling today?" or "How can I best help you?" or "What question do you have that I can help answer?"     Pt is wanting to know if he needs to get tetanus vaccine booster  Protocols used: Information Only Call - No Triage-A-AH

## 2021-07-13 NOTE — Telephone Encounter (Signed)
Patient will be triaged, see nurse triage encounter.

## 2021-07-13 NOTE — Telephone Encounter (Signed)
Pt states that he steped on a nail that went through his shoe but does not think it entered his skin at all. He did not see any blood. He wants to know if he should get a  tetanus shot. The nail was not rusty

## 2021-08-03 ENCOUNTER — Encounter: Payer: Self-pay | Admitting: Family Medicine

## 2021-08-04 ENCOUNTER — Ambulatory Visit: Payer: 59 | Admitting: Family Medicine

## 2021-08-04 ENCOUNTER — Other Ambulatory Visit: Payer: Self-pay

## 2021-08-04 ENCOUNTER — Encounter: Payer: Self-pay | Admitting: Family Medicine

## 2021-08-04 VITALS — BP 104/76 | HR 86 | Temp 98.1°F | Ht 70.0 in | Wt 263.0 lb

## 2021-08-04 DIAGNOSIS — J0100 Acute maxillary sinusitis, unspecified: Secondary | ICD-10-CM

## 2021-08-04 LAB — POC COVID19 BINAXNOW: SARS Coronavirus 2 Ag: NEGATIVE

## 2021-08-04 MED ORDER — AZITHROMYCIN 250 MG PO TABS: ORAL_TABLET | ORAL | 0 refills | Status: AC

## 2021-08-04 NOTE — Progress Notes (Signed)
Date:  08/04/2021   Name:  Richard Frost   DOB:  02-10-1971   MRN:  [id number]   Chief Complaint: Cough (Green production. No fever or chills, no body aches or headaches. )  Sinusitis This is a new problem. The current episode started in the past 7 days (thursday). The problem has been waxing and waning since onset. There has been no fever. Associated symptoms include congestion, coughing, diaphoresis and a sore throat. Pertinent negatives include no chills, ear pain, headaches, hoarse voice, neck pain, shortness of breath, sinus pressure, sneezing or swollen glands. Past treatments include oral decongestants (mucinex D). The treatment provided mild relief.   Lab Results  Component Value Date   NA 142 10/24/2020   K 4.1 10/24/2020   CO2 24 10/24/2020   GLUCOSE 86 10/24/2020   BUN 11 10/24/2020   CREATININE 1.13 10/24/2020   CALCIUM 9.5 10/24/2020   EGFR 79 10/24/2020   GFRNONAA 68 05/07/2020   Lab Results  Component Value Date   CHOL 139 10/24/2020   HDL 53 10/24/2020   LDLCALC 70 10/24/2020   TRIG 81 10/24/2020   CHOLHDL 4.0 11/20/2018   Lab Results  Component Value Date   TSH 2.150 08/08/2015   Lab Results  Component Value Date   HGBA1C 5.7 (H) 05/08/2021   Lab Results  Component Value Date   WBC 6.7 04/04/2017   HGB 14.1 04/04/2017   HCT 42.9 04/04/2017   MCV 90.9 04/04/2017   PLT 178 04/04/2017   Lab Results  Component Value Date   ALT 38 10/24/2020   AST 31 10/24/2020   ALKPHOS 116 10/24/2020   BILITOT 0.9 10/24/2020   Lab Results  Component Value Date   VD25OH 27.0 (L) 11/06/2015     Review of Systems  Constitutional:  Positive for diaphoresis. Negative for chills and fever.  HENT:  Positive for congestion and sore throat. Negative for drooling, ear discharge, ear pain, hoarse voice, sinus pressure and sneezing.   Respiratory:  Positive for cough. Negative for shortness of breath and wheezing.   Cardiovascular:  Negative for chest pain,  palpitations and leg swelling.  Gastrointestinal:  Negative for abdominal pain, blood in stool, constipation, diarrhea and nausea.  Endocrine: Negative for polydipsia.  Genitourinary:  Negative for dysuria, frequency, hematuria and urgency.  Musculoskeletal:  Negative for back pain, myalgias and neck pain.  Skin:  Negative for rash.  Allergic/Immunologic: Negative for environmental allergies.  Neurological:  Negative for dizziness and headaches.  Hematological:  Does not bruise/bleed easily.  Psychiatric/Behavioral:  Negative for suicidal ideas. The patient is not nervous/anxious.    Patient Active Problem List   Diagnosis Date Noted   Colon cancer screening 20/23/3435   Biliary colic    Gallstones 68/61/6837   Fatty liver disease, nonalcoholic 29/09/1113   Vitamin D deficiency 08/11/2015   OCD (obsessive compulsive disorder) 08/08/2015   Allergic rhinitis 08/08/2015   Nephrolithiasis 08/08/2015   Prediabetes 08/08/2015   OSA treated with BiPAP 08/08/2015   Obesity, Class II, BMI 35-39.9, with comorbidity 08/08/2015    Allergies  Allergen Reactions   Septra [Sulfamethoxazole-Trimethoprim] Hives   Sulfa Antibiotics Hives    Past Surgical History:  Procedure Laterality Date   CHOLECYSTECTOMY N/A 04/05/2017   Procedure: LAPAROSCOPIC CHOLECYSTECTOMY WITH INTRAOPERATIVE CHOLANGIOGRAM;  Surgeon: Florene Glen, MD;  Location: [facility];  Service: General;  Laterality: N/A;   COLONOSCOPY WITH PROPOFOL N/A 06/04/2020   Procedure: COLONOSCOPY WITH PROPOFOL;  Surgeon: Lin Landsman, MD;  Location: [facility];  Service: Gastroenterology;  Laterality: N/A;   INTERNAL URETHROTOMY     remove kidney stone   LITHOTRIPSY     LITHOTRIPSY     x 7    Social History   Tobacco Use   Smoking status: Never   Smokeless tobacco: Never  Vaping Use   Vaping Use: Never used  Substance Use Topics   Alcohol use: Yes    Alcohol/week: 0.0 standard drinks    Comment: 1 drink per month     Drug use: No     Medication list has been reviewed and updated.  Current Meds  Medication Sig   ARIPiprazole (ABILIFY) 15 MG tablet Take 15 mg by mouth at bedtime. CBC   atorvastatin (LIPITOR) 10 MG tablet Take 1 tablet (10 mg total) by mouth daily.   cetirizine (ZYRTEC) 10 MG tablet Take 10 mg by mouth at bedtime. otc   clonazePAM (KLONOPIN) 2 MG tablet Take 1 tablet by mouth at bedtime. cbc   fluvoxaMINE (LUVOX) 100 MG tablet Take 100 mg by mouth 2 (two) times daily. cbc   Omega 3 1000 MG CAPS Take 1 capsule (1,000 mg total) by mouth 2 (two) times daily.   ONETOUCH VERIO test strip TEST BLOOD SUGAR ONCE DAILY    PHQ 2/9 Scores 05/08/2021 05/07/2020 06/21/2019 11/20/2018  PHQ - 2 Score 0 0 0 0  PHQ- 9 Score 0 0 0 0    GAD 7 : Generalized Anxiety Score 05/08/2021 05/07/2020 06/21/2019  Nervous, Anxious, on Edge 0 0 0  Control/stop worrying 0 0 0  Worry too much - different things 0 0 0  Trouble relaxing 0 0 0  Restless 0 0 0  Easily annoyed or irritable 0 0 0  Afraid - awful might happen 0 0 0  Total GAD 7 Score 0 0 0    BP Readings from Last 3 Encounters:  08/04/21 104/76  05/08/21 120/70  10/24/20 110/68    Physical Exam Vitals and nursing note reviewed.  HENT:     Head: Normocephalic.     Right Ear: Hearing, tympanic membrane, ear canal and external ear normal. There is no impacted cerumen.     Left Ear: Hearing, tympanic membrane, ear canal and external ear normal. There is no impacted cerumen.     Nose: Nose normal. No congestion or rhinorrhea.     Right Turbinates: Swollen. Not enlarged.     Left Turbinates: Swollen. Not enlarged.     Right Sinus: No maxillary sinus tenderness or frontal sinus tenderness.     Left Sinus: No maxillary sinus tenderness or frontal sinus tenderness.     Mouth/Throat:     Mouth: Mucous membranes are moist.     Pharynx: Oropharynx is clear.  Eyes:     General: No scleral icterus.       Right eye: No discharge.        Left eye:  No discharge.     Conjunctiva/sclera: Conjunctivae normal.     Pupils: Pupils are equal, round, and reactive to light.  Neck:     Thyroid: No thyromegaly.     Vascular: No JVD.     Trachea: No tracheal deviation.  Cardiovascular:     Rate and Rhythm: Normal rate and regular rhythm.     Pulses: Normal pulses.     Heart sounds: Normal heart sounds, S1 normal and S2 normal. No murmur heard. No systolic murmur is present.  No diastolic murmur is present.  No friction rub. No gallop. No S3 or S4 sounds.  Pulmonary:     Effort: No respiratory distress.     Breath sounds: Normal breath sounds. No wheezing, rhonchi or rales.  Abdominal:     General: Bowel sounds are normal.     Palpations: Abdomen is soft. There is no hepatomegaly, splenomegaly or mass.     Tenderness: There is no abdominal tenderness. There is no guarding or rebound.  Musculoskeletal:        General: No tenderness. Normal range of motion.     Cervical back: Full passive range of motion without pain, normal range of motion and neck supple.  Lymphadenopathy:     Cervical: No cervical adenopathy.  Skin:    General: Skin is warm.     Findings: No rash.  Neurological:     Mental Status: He is alert and oriented to person, place, and time.    Wt Readings from Last 3 Encounters:  08/04/21 263 lb (119.3 kg)  05/08/21 272 lb (123.4 kg)  10/24/20 263 lb (119.3 kg)    BP 104/76    Pulse 86    Ht '5\' 10"'  (1.778 m)    Wt 263 lb (119.3 kg)    SpO2 95%    BMI 37.74 kg/m   Assessment and Plan: 1. Acute maxillary sinusitis, recurrence not specified New onset.  Persistent.  Relatively stable.  With cough and upper respiratory drainage.  There is productive of greenish discharge but without fever chills.  COVID test is negative.  Exam is consistent with a sinusitis of the maxillary area.  We will treat with azithromycin to 50 mg 2 today followed by 1 a day for 4 days. - POC COVID-19

## 2021-11-05 ENCOUNTER — Encounter: Payer: Self-pay | Admitting: Family Medicine

## 2021-11-05 ENCOUNTER — Ambulatory Visit: Payer: 59 | Admitting: Family Medicine

## 2021-11-05 VITALS — BP 110/70 | HR 64 | Ht 70.0 in | Wt 257.0 lb

## 2021-11-05 DIAGNOSIS — E782 Mixed hyperlipidemia: Secondary | ICD-10-CM

## 2021-11-05 DIAGNOSIS — R7303 Prediabetes: Secondary | ICD-10-CM

## 2021-11-05 DIAGNOSIS — H8110 Benign paroxysmal vertigo, unspecified ear: Secondary | ICD-10-CM

## 2021-11-05 MED ORDER — ATORVASTATIN CALCIUM 10 MG PO TABS: 10.0000 mg | ORAL_TABLET | Freq: Every day | ORAL | 1 refills | Status: DC

## 2021-11-05 NOTE — Progress Notes (Signed)
? ? ?Date:  11/05/2021  ? ?Name:  Richard Frost   DOB:  05-Jul-1971   MRN:  [id number] ? ? ?Chief Complaint: Hyperlipidemia and Prediabetes ? ?Hyperlipidemia ?This is a chronic problem. The current episode started more than 1 year ago. The problem is controlled. Recent lipid tests were reviewed and are normal. He has no history of chronic renal disease, diabetes, hypothyroidism, liver disease, obesity or nephrotic syndrome. There are no known factors aggravating his hyperlipidemia. Pertinent negatives include no chest pain, focal sensory loss, focal weakness, leg pain, myalgias or shortness of breath. Current antihyperlipidemic treatment includes statins. The current treatment provides moderate improvement of lipids. There are no compliance problems.  Risk factors for coronary artery disease include dyslipidemia and male sex.  ?Diabetes ?He presents for his follow-up diabetic visit. He has type 2 diabetes mellitus. His disease course has been stable. There are no hypoglycemic associated symptoms. Pertinent negatives for hypoglycemia include no headaches or nervousness/anxiousness. There are no diabetic associated symptoms. Pertinent negatives for diabetes include no chest pain and no polydipsia. There are no hypoglycemic complications. Symptoms are stable. There are no diabetic complications. Risk factors for coronary artery disease include diabetes mellitus and dyslipidemia. He is following a generally healthy diet. Meal planning includes avoidance of concentrated sweets and carbohydrate counting. Exercise: not as i should. His breakfast blood glucose is taken between 8-9 am. His breakfast blood glucose range is generally 70-90 mg/dl.  ? ?Lab Results  ?Component Value Date  ? NA 142 10/24/2020  ? K 4.1 10/24/2020  ? CO2 24 10/24/2020  ? GLUCOSE 86 10/24/2020  ? BUN 11 10/24/2020  ? CREATININE 1.13 10/24/2020  ? CALCIUM 9.5 10/24/2020  ? EGFR 79 10/24/2020  ? GFRNONAA 68 05/07/2020  ? ?Lab Results  ?Component  Value Date  ? CHOL 139 10/24/2020  ? HDL 53 10/24/2020  ? [place] 70 10/24/2020  ? TRIG 81 10/24/2020  ? CHOLHDL 4.0 11/20/2018  ? ?Lab Results  ?Component Value Date  ? TSH 2.150 08/08/2015  ? ?Lab Results  ?Component Value Date  ? HGBA1C 5.7 (H) 05/08/2021  ? ?Lab Results  ?Component Value Date  ? WBC 6.7 04/04/2017  ? HGB 14.1 04/04/2017  ? HCT 42.9 04/04/2017  ? MCV 90.9 04/04/2017  ? PLT 178 04/04/2017  ? ?Lab Results  ?Component Value Date  ? ALT 38 10/24/2020  ? AST 31 10/24/2020  ? ALKPHOS 116 10/24/2020  ? BILITOT 0.9 10/24/2020  ? ?Lab Results  ?Component Value Date  ? VD25OH 27.0 (L) 11/06/2015  ?  ? ?Review of Systems  ?Constitutional:  Negative for chills and fever.  ?HENT:  Negative for drooling, ear discharge, ear pain and sore throat.   ?Respiratory:  Negative for cough, shortness of breath and wheezing.   ?Cardiovascular:  Negative for chest pain, palpitations and leg swelling.  ?Gastrointestinal:  Negative for abdominal pain, blood in stool, constipation, diarrhea and nausea.  ?Endocrine: Negative for polydipsia.  ?Genitourinary:  Negative for dysuria, frequency, hematuria and urgency.  ?Musculoskeletal:  Negative for back pain, myalgias and neck pain.  ?Skin:  Negative for rash.  ?Allergic/Immunologic: Negative for environmental allergies.  ?Neurological:  Negative for focal weakness and headaches.  ?Hematological:  Does not bruise/bleed easily.  ?Psychiatric/Behavioral:  Negative for suicidal ideas. The patient is not nervous/anxious.   ? ?Patient Active Problem List  ? Diagnosis Date Noted  ? Colon cancer screening 05/14/2020  ? Biliary colic   ? Gallstones 08/19/2015  ? Fatty liver disease, nonalcoholic  08/19/2015  ? Vitamin D deficiency 08/11/2015  ? OCD (obsessive compulsive disorder) 08/08/2015  ? Allergic rhinitis 08/08/2015  ? Nephrolithiasis 08/08/2015  ? Prediabetes 08/08/2015  ? OSA treated with BiPAP 08/08/2015  ? Obesity, Class II, BMI 35-39.9, with comorbidity 08/08/2015   ? ? ?Allergies  ?Allergen Reactions  ? Septra [Sulfamethoxazole-Trimethoprim] Hives  ? Sulfa Antibiotics Hives  ? ? ?Past Surgical History:  ?Procedure Laterality Date  ? CHOLECYSTECTOMY N/A 04/05/2017  ? Procedure: LAPAROSCOPIC CHOLECYSTECTOMY WITH INTRAOPERATIVE CHOLANGIOGRAM;  Surgeon: Florene Glen, MD;  Location: [facility] ORS;  Service: General;  Laterality: N/A;  ? COLONOSCOPY WITH PROPOFOL N/A 06/04/2020  ? Procedure: COLONOSCOPY WITH PROPOFOL;  Surgeon: Lin Landsman, MD;  [facility]: [facility] ENDOSCOPY;  Service: Gastroenterology;  Laterality: N/A;  ? INTERNAL URETHROTOMY    ? remove kidney stone  ? LITHOTRIPSY    ? LITHOTRIPSY    ? x 7  ? ? ?Social History  ? ?Tobacco Use  ? Smoking status: Never  ? Smokeless tobacco: Never  ?Vaping Use  ? Vaping Use: Never used  ?Substance Use Topics  ? Alcohol use: Yes  ?  Alcohol/week: 0.0 standard drinks  ?  Comment: 1 drink per month   ? Drug use: No  ? ? ? ?Medication list has been reviewed and updated. ? ?Current Meds  ?Medication Sig  ? ARIPiprazole (ABILIFY) 15 MG tablet Take 15 mg by mouth at bedtime. CBC  ? atorvastatin (LIPITOR) 10 MG tablet Take 1 tablet (10 mg total) by mouth daily.  ? cetirizine (ZYRTEC) 10 MG tablet Take 10 mg by mouth at bedtime. otc  ? clonazePAM (KLONOPIN) 2 MG tablet Take 1 tablet by mouth at bedtime. cbc  ? fluvoxaMINE (LUVOX) 100 MG tablet Take 100 mg by mouth 2 (two) times daily. Cbc 153m in am and 1584mqhs  ? Omega 3 1000 MG CAPS Take 1 capsule (1,000 mg total) by mouth 2 (two) times daily.  ? ONETOUCH VERIO test strip TEST BLOOD SUGAR ONCE DAILY  ? ? ? ?  11/05/2021  ?  8:10 AM 05/08/2021  ?  8:22 AM 05/07/2020  ? 10:03 AM 06/21/2019  ?  8:29 AM  ?GAD 7 : Generalized Anxiety Score  ?Nervous, Anxious, on Edge 0 0 0 0  ?Control/stop worrying 0 0 0 0  ?Worry too much - different things 0 0 0 0  ?Trouble relaxing 0 0 0 0  ?Restless 0 0 0 0  ?Easily annoyed or irritable 0 0 0 0  ?Afraid - awful might happen 0 0 0 0  ?Total GAD 7 Score 0  0 0 0  ?Anxiety Difficulty Not difficult at all     ? ? ? ?  11/05/2021  ?  8:10 AM  ?Depression screen PHQ 2/9  ?Decreased Interest 0  ?Down, Depressed, Hopeless 0  ?PHQ - 2 Score 0  ?Altered sleeping 0  ?Tired, decreased energy 0  ?Change in appetite 0  ?Feeling bad or failure about yourself  0  ?Trouble concentrating 0  ?Moving slowly or fidgety/restless 0  ?Suicidal thoughts 0  ?PHQ-9 Score 0  ?Difficult doing work/chores Not difficult at all  ? ? ?BP Readings from Last 3 Encounters:  ?11/05/21 110/70  ?08/04/21 104/76  ?05/08/21 120/70  ? ? ?Physical Exam ?Vitals and nursing note reviewed.  ?HENT:  ?   Head: Normocephalic.  ?   Right Ear: Tympanic membrane and external ear normal.  ?   Left Ear: Tympanic membrane and external ear normal.  ?  Nose: Nose normal. No congestion or rhinorrhea.  ?Eyes:  ?   General: No scleral icterus.    ?   Right eye: No discharge.     ?   Left eye: No discharge.  ?   Conjunctiva/sclera: Conjunctivae normal.  ?   Pupils: Pupils are equal, round, and reactive to light.  ?Neck:  ?   Thyroid: No thyromegaly.  ?   Vascular: No JVD.  ?   Trachea: No tracheal deviation.  ?Cardiovascular:  ?   Rate and Rhythm: Normal rate and regular rhythm.  ?   Heart sounds: Normal heart sounds. No murmur heard. ?  No friction rub. No gallop.  ?Pulmonary:  ?   Effort: No respiratory distress.  ?   Breath sounds: Normal breath sounds. No stridor. No wheezing, rhonchi or rales.  ?Abdominal:  ?   General: Bowel sounds are normal.  ?   Palpations: Abdomen is soft. There is no mass.  ?   Tenderness: There is no abdominal tenderness. There is no guarding or rebound.  ?Musculoskeletal:     ?   General: No tenderness. Normal range of motion.  ?   Cervical back: Normal range of motion and neck supple.  ?Lymphadenopathy:  ?   Cervical: No cervical adenopathy.  ?Skin: ?   General: Skin is warm.  ?   Findings: No rash.  ?Neurological:  ?   Mental Status: He is alert and oriented to person, place, and time.  ?    Cranial Nerves: No cranial nerve deficit.  ?   Deep Tendon Reflexes: Reflexes are normal and symmetric.  ? ? ?Wt Readings from Last 3 Encounters:  ?11/05/21 257 lb (116.6 kg)  ?08/04/21 263 lb (119.3 kg)  ?05/08/21 2

## 2021-11-06 LAB — COMPREHENSIVE METABOLIC PANEL
ALT: 38 IU/L (ref 0–44)
AST: 31 IU/L (ref 0–40)
Albumin/Globulin Ratio: 1.8 (ref 1.2–2.2)
Albumin: 4.6 g/dL (ref 3.8–4.9)
Alkaline Phosphatase: 90 IU/L (ref 44–121)
BUN/Creatinine Ratio: 9 (ref 9–20)
BUN: 10 mg/dL (ref 6–24)
Bilirubin Total: 0.6 mg/dL (ref 0.0–1.2)
CO2: 25 mmol/L (ref 20–29)
Calcium: 9.5 mg/dL (ref 8.7–10.2)
Chloride: 103 mmol/L (ref 96–106)
Creatinine, Ser: 1.16 mg/dL (ref 0.76–1.27)
Globulin, Total: 2.6 g/dL (ref 1.5–4.5)
Glucose: 110 mg/dL — ABNORMAL HIGH (ref 70–99)
Potassium: 4.3 mmol/L (ref 3.5–5.2)
Sodium: 142 mmol/L (ref 134–144)
Total Protein: 7.2 g/dL (ref 6.0–8.5)
eGFR: 76 mL/min/{1.73_m2} (ref >59)

## 2021-11-06 LAB — LIPID PANEL WITH LDL/HDL RATIO
Cholesterol, Total: 158 mg/dL (ref 100–199)
HDL: 61 mg/dL (ref >39)
LDL Chol Calc (NIH): 75 mg/dL (ref 0–99)
LDL/HDL Ratio: 1.2 ratio (ref 0.0–3.6)
Triglycerides: 124 mg/dL (ref 0–149)
VLDL Cholesterol Cal: 22 mg/dL (ref 5–40)

## 2021-11-06 LAB — HEMOGLOBIN A1C
Est. average glucose Bld gHb Est-mCnc: 117 mg/dL
Hgb A1c MFr Bld: 5.7 % — ABNORMAL HIGH (ref 4.8–5.6)

## 2022-03-08 ENCOUNTER — Ambulatory Visit: Payer: 59 | Admitting: Family Medicine

## 2022-03-15 ENCOUNTER — Encounter: Payer: Self-pay | Admitting: Family Medicine

## 2022-03-29 ENCOUNTER — Encounter: Payer: Self-pay | Admitting: Family Medicine

## 2022-03-29 ENCOUNTER — Ambulatory Visit: Payer: 59 | Admitting: Family Medicine

## 2022-03-29 VITALS — BP 110/70 | HR 80 | Ht 70.0 in | Wt 249.0 lb

## 2022-03-29 DIAGNOSIS — E782 Mixed hyperlipidemia: Secondary | ICD-10-CM | POA: Diagnosis not present

## 2022-03-29 DIAGNOSIS — R7303 Prediabetes: Secondary | ICD-10-CM

## 2022-03-29 LAB — COMPREHENSIVE METABOLIC PANEL
Albumin: 4.4 (ref 3.5–5.0)
Calcium: 9.1 (ref 8.7–10.7)
eGFR: 65

## 2022-03-29 LAB — BASIC METABOLIC PANEL
BUN: 15 (ref 4–21)
Chloride: 104 (ref 99–108)
Creatinine: 1.3 (ref 0.6–1.3)
Glucose: 90
Potassium: 3.9 mEq/L (ref 3.5–5.1)
Sodium: 143 (ref 137–147)

## 2022-03-29 LAB — HEMOGLOBIN A1C: Hemoglobin A1C: 5.7

## 2022-03-29 MED ORDER — ATORVASTATIN CALCIUM 10 MG PO TABS: 10.0000 mg | ORAL_TABLET | Freq: Every day | ORAL | 1 refills | Status: DC

## 2022-03-29 NOTE — Progress Notes (Signed)
Date:  03/29/2022   Name:  Richard Frost   DOB:  03/15/1971   MRN:  [id number]   Chief Complaint: Prediabetes and Hyperlipidemia  Hyperlipidemia This is a chronic problem. The current episode started more than 1 year ago. The problem is controlled. Recent lipid tests were reviewed and are normal. Exacerbating diseases include diabetes and obesity. He has no history of chronic renal disease, hypothyroidism, liver disease or nephrotic syndrome. Pertinent negatives include no chest pain, focal sensory loss, focal weakness, leg pain, myalgias or shortness of breath. Current antihyperlipidemic treatment includes statins. The current treatment provides moderate improvement of lipids. There are no compliance problems.  Risk factors for coronary artery disease include dyslipidemia and diabetes mellitus.  Diabetes He presents for his follow-up diabetic visit. Diabetes type: prediabetes. There are no hypoglycemic associated symptoms. Pertinent negatives for hypoglycemia include no dizziness, headaches or nervousness/anxiousness. Pertinent negatives for diabetes include no blurred vision, no chest pain, no fatigue, no foot paresthesias, no foot ulcerations, no polydipsia, no polyuria, no visual change, no weakness and no weight loss. There are no hypoglycemic complications. Symptoms are stable. There are no diabetic complications. Risk factors for coronary artery disease include dyslipidemia. Current diabetic treatment includes diet. He is following a generally healthy diet. Meal planning includes avoidance of concentrated sweets and carbohydrate counting. He participates in exercise intermittently. An ACE inhibitor/angiotensin II receptor blocker is being taken.    Lab Results  Component Value Date   NA 142 11/05/2021   K 4.3 11/05/2021   CO2 25 11/05/2021   GLUCOSE 110 (H) 11/05/2021   BUN 10 11/05/2021   CREATININE 1.16 11/05/2021   CALCIUM 9.5 11/05/2021   EGFR 76 11/05/2021   GFRNONAA 68  05/07/2020   Lab Results  Component Value Date   CHOL 158 11/05/2021   HDL 61 11/05/2021   LDLCALC 75 11/05/2021   TRIG 124 11/05/2021   CHOLHDL 4.0 11/20/2018   Lab Results  Component Value Date   TSH 2.150 08/08/2015   Lab Results  Component Value Date   HGBA1C 5.7 (H) 11/05/2021   Lab Results  Component Value Date   WBC 6.7 04/04/2017   HGB 14.1 04/04/2017   HCT 42.9 04/04/2017   MCV 90.9 04/04/2017   PLT 178 04/04/2017   Lab Results  Component Value Date   ALT 38 11/05/2021   AST 31 11/05/2021   ALKPHOS 90 11/05/2021   BILITOT 0.6 11/05/2021   Lab Results  Component Value Date   VD25OH 27.0 (L) 11/06/2015     Review of Systems  Constitutional:  Negative for chills, fatigue, fever and weight loss.  HENT:  Negative for drooling, ear discharge, ear pain and sore throat.   Eyes:  Negative for blurred vision.  Respiratory:  Negative for cough, shortness of breath and wheezing.   Cardiovascular:  Negative for chest pain, palpitations and leg swelling.  Gastrointestinal:  Negative for abdominal pain, blood in stool, constipation, diarrhea and nausea.  Endocrine: Negative for polydipsia and polyuria.  Genitourinary:  Negative for dysuria, frequency, hematuria and urgency.  Musculoskeletal:  Negative for back pain, myalgias and neck pain.  Skin:  Negative for rash.  Allergic/Immunologic: Negative for environmental allergies.  Neurological:  Negative for dizziness, focal weakness, weakness and headaches.  Hematological:  Does not bruise/bleed easily.  Psychiatric/Behavioral:  Negative for suicidal ideas. The patient is not nervous/anxious.     Patient Active Problem List   Diagnosis Date Noted   Colon cancer screening 05/14/2020  Biliary colic    Gallstones 63/87/5643   Fatty liver disease, nonalcoholic 32/95/1884   Vitamin D deficiency 08/11/2015   OCD (obsessive compulsive disorder) 08/08/2015   Allergic rhinitis 08/08/2015   Nephrolithiasis 08/08/2015    Prediabetes 08/08/2015   OSA treated with BiPAP 08/08/2015   Obesity, Class II, BMI 35-39.9, with comorbidity 08/08/2015    Allergies  Allergen Reactions   Septra [Sulfamethoxazole-Trimethoprim] Hives   Sulfa Antibiotics Hives    Past Surgical History:  Procedure Laterality Date   CHOLECYSTECTOMY N/A 04/05/2017   Procedure: LAPAROSCOPIC CHOLECYSTECTOMY WITH INTRAOPERATIVE CHOLANGIOGRAM;  Surgeon: Florene Glen, MD;  Location: [facility] ORS;  Service: General;  Laterality: N/A;   COLONOSCOPY WITH PROPOFOL N/A 06/04/2020   Procedure: COLONOSCOPY WITH PROPOFOL;  Surgeon: Lin Landsman, MD;  Location: [facility] ENDOSCOPY;  Service: Gastroenterology;  Laterality: N/A;   INTERNAL URETHROTOMY     remove kidney stone   LITHOTRIPSY     LITHOTRIPSY     x 7    Social History   Tobacco Use   Smoking status: Never   Smokeless tobacco: Never  Vaping Use   Vaping Use: Never used  Substance Use Topics   Alcohol use: Yes    Alcohol/week: 0.0 standard drinks of alcohol    Comment: 1 drink per month    Drug use: No     Medication list has been reviewed and updated.  Current Meds  Medication Sig   ARIPiprazole (ABILIFY) 15 MG tablet Take 15 mg by mouth at bedtime. CBC   atorvastatin (LIPITOR) 10 MG tablet Take 1 tablet (10 mg total) by mouth daily.   cetirizine (ZYRTEC) 10 MG tablet Take 10 mg by mouth at bedtime. otc   clonazePAM (KLONOPIN) 2 MG tablet Take 1 tablet by mouth at bedtime. cbc   fluvoxaMINE (LUVOX) 100 MG tablet Take 100 mg by mouth 2 (two) times daily. Cbc 178m in am and 1568mqhs   ONETOUCH VERIO test strip TEST BLOOD SUGAR ONCE DAILY       03/29/2022    7:57 AM 11/05/2021    8:10 AM 05/08/2021    8:22 AM 05/07/2020   10:03 AM  GAD 7 : Generalized Anxiety Score  Nervous, Anxious, on Edge 2 0 0 0  Control/stop worrying 0 0 0 0  Worry too much - different things 0 0 0 0  Trouble relaxing 1 0 0 0  Restless 1 0 0 0  Easily annoyed or irritable 0 0 0 0  Afraid -  awful might happen 0 0 0 0  Total GAD 7 Score 4 0 0 0  Anxiety Difficulty Somewhat difficult Not difficult at all         03/29/2022    7:57 AM 11/05/2021    8:10 AM 05/08/2021    8:22 AM  Depression screen PHQ 2/9  Decreased Interest 0 0 0  Down, Depressed, Hopeless 0 0 0  PHQ - 2 Score 0 0 0  Altered sleeping 1 0 0  Tired, decreased energy 1 0 0  Change in appetite 1 0 0  Feeling bad or failure about yourself  0 0 0  Trouble concentrating 0 0 0  Moving slowly or fidgety/restless 1 0 0  Suicidal thoughts 0 0 0  PHQ-9 Score 4 0 0  Difficult doing work/chores Somewhat difficult Not difficult at all     BP Readings from Last 3 Encounters:  03/29/22 110/70  11/05/21 110/70  08/04/21 104/76    Physical Exam Vitals and nursing  note reviewed.  HENT:     Head: Normocephalic.     Right Ear: Tympanic membrane and external ear normal.     Left Ear: Tympanic membrane and external ear normal.     Nose: Nose normal. No congestion or rhinorrhea.     Mouth/Throat:     Mouth: Mucous membranes are moist.  Eyes:     General: No scleral icterus.       Right eye: No discharge.        Left eye: No discharge.     Conjunctiva/sclera: Conjunctivae normal.     Pupils: Pupils are equal, round, and reactive to light.  Neck:     Thyroid: No thyromegaly.     Vascular: No JVD.     Trachea: No tracheal deviation.  Cardiovascular:     Rate and Rhythm: Normal rate and regular rhythm.     Heart sounds: Normal heart sounds. No murmur heard.    No friction rub. No gallop.  Pulmonary:     Effort: No respiratory distress.     Breath sounds: Normal breath sounds. No wheezing or rales.  Abdominal:     General: Bowel sounds are normal.     Palpations: Abdomen is soft. There is no mass.     Tenderness: There is no abdominal tenderness. There is no guarding or rebound.  Musculoskeletal:        General: No tenderness. Normal range of motion.     Cervical back: Normal range of motion and neck supple.   Lymphadenopathy:     Cervical: No cervical adenopathy.  Skin:    General: Skin is warm.     Findings: No rash.  Neurological:     Mental Status: He is alert.     Wt Readings from Last 3 Encounters:  03/29/22 249 lb (112.9 kg)  11/05/21 257 lb (116.6 kg)  08/04/21 263 lb (119.3 kg)    BP 110/70   Pulse 80   Ht '5\' 10"'  (1.778 m)   Wt 249 lb (112.9 kg)   BMI 35.73 kg/m   Assessment and Plan:  1. Mixed hyperlipidemia Chronic.  Controlled.  Stable.  Continue atorvastatin 20 mg once.  Reviewed previous lipid panel is acceptable. - atorvastatin (LIPITOR) 10 MG tablet; Take 1 tablet (10 mg total) by mouth daily.  Dispense: 90 tablet; Refill: 1  2. Prediabetes .  Controlled.  Stable.  This is controlled with diet.  We will check A1c and microalbumin.  This level of control as well as renal function panel for electrolytes and GFR - HgB A1c - Microalbumin, urine - Renal Function Panel    Otilio Miu, MD

## 2022-03-29 NOTE — Patient Instructions (Signed)
Mediterranean Diet ?A Mediterranean diet refers to food and lifestyle choices that are based on the traditions of countries located on the [place]. It focuses on eating more fruits, vegetables, whole grains, beans, nuts, seeds, and heart-healthy fats, and eating less dairy, meat, eggs, and processed foods with added sugar, salt, and fat. This way of eating has been shown to help prevent certain conditions and improve outcomes for people who have chronic diseases, like kidney disease and heart disease. ?What are tips for following this plan? ?Reading food labels ?Check the serving size of packaged foods. For foods such as rice and pasta, the serving size refers to the amount of cooked product, not dry. ?Check the total fat in packaged foods. Avoid foods that have saturated fat or trans fats. ?Check the ingredient list for added sugars, such as corn syrup. ?Shopping ? ?Buy a variety of foods that offer a balanced diet, including: ?Fresh fruits and vegetables (produce). ?Grains, beans, nuts, and seeds. Some of these may be available in unpackaged forms or large amounts (in bulk). ?Fresh seafood. ?Poultry and eggs. ?Low-fat dairy products. ?Buy whole ingredients instead of prepackaged foods. ?Buy fresh fruits and vegetables in-season from local farmers markets. ?Buy plain frozen fruits and vegetables. ?If you do not have access to quality fresh seafood, buy precooked frozen shrimp or canned fish, such as tuna, salmon, or sardines. ?Stock your pantry so you always have certain foods on hand, such as olive oil, canned tuna, canned tomatoes, rice, pasta, and beans. ?Cooking ?Cook foods with extra-virgin olive oil instead of using butter or other vegetable oils. ?Have meat as a side dish, and have vegetables or grains as your main dish. This means having meat in small portions or adding small amounts of meat to foods like pasta or stew. ?Use beans or vegetables instead of meat in common dishes like chili or  lasagna. ?Experiment with different cooking methods. Try roasting, broiling, steaming, and saut?ing vegetables. ?Add frozen vegetables to soups, stews, pasta, or rice. ?Add nuts or seeds for added healthy fats and plant protein at each meal. You can add these to yogurt, salads, or vegetable dishes. ?Marinate fish or vegetables using olive oil, lemon juice, garlic, and fresh herbs. ?Meal planning ?Plan to eat one vegetarian meal one day each week. Try to work up to two vegetarian meals, if possible. ?Eat seafood two or more times a week. ?Have healthy snacks readily available, such as: ?Vegetable sticks with hummus. ?Greek yogurt. ?Fruit and nut trail mix. ?Eat balanced meals throughout the week. This includes: ?Fruit: 2-3 servings a day. ?Vegetables: 4-5 servings a day. ?Low-fat dairy: 2 servings a day. ?Fish, poultry, or lean meat: 1 serving a day. ?Beans and legumes: 2 or more servings a week. ?Nuts and seeds: 1-2 servings a day. ?Whole grains: 6-8 servings a day. ?Extra-virgin olive oil: 3-4 servings a day. ?Limit red meat and sweets to only a few servings a month. ?Lifestyle ? ?Cook and eat meals together with your family, when possible. ?Drink enough fluid to keep your urine pale yellow. ?Be physically active every day. This includes: ?Aerobic exercise like running or swimming. ?Leisure activities like gardening, walking, or housework. ?Get 7-8 hours of sleep each night. ?If recommended by your health care provider, drink red wine in moderation. This means 1 glass a day for nonpregnant women and 2 glasses a day for men. A glass of wine equals 5 oz (150 mL). ?What foods should I eat? ?Fruits ?Apples. Apricots. Avocado. Berries. Bananas. Cherries. Dates.   Figs. Grapes. Lemons. Melon. Oranges. Peaches. Plums. Pomegranate. ?Vegetables ?Artichokes. Beets. Broccoli. Cabbage. Carrots. Eggplant. Green beans. Chard. Kale. Spinach. Onions. Leeks. Peas. Squash. Tomatoes. Peppers. Radishes. ?Grains ?Whole-grain pasta. Brown  rice. Bulgur wheat. Polenta. Couscous. Whole-wheat bread. Oatmeal. Quinoa. ?Meats and other proteins ?Beans. Almonds. Sunflower seeds. Pine nuts. Peanuts. Cod. Salmon. Scallops. Shrimp. Tuna. Tilapia. Clams. Oysters. Eggs. Poultry without skin. ?Dairy ?Low-fat milk. Cheese. Greek yogurt. ?Fats and oils ?Extra-virgin olive oil. Avocado oil. Grapeseed oil. ?Beverages ?Water. Red wine. Herbal tea. ?Sweets and desserts ?Greek yogurt with honey. Baked apples. Poached pears. Trail mix. ?Seasonings and condiments ?Basil. Cilantro. Coriander. Cumin. Mint. Parsley. Sage. Rosemary. Tarragon. Garlic. Oregano. Thyme. Pepper. Balsamic vinegar. Tahini. Hummus. Tomato sauce. Olives. Mushrooms. ?The items listed above may not be a complete list of foods and beverages you can eat. Contact a dietitian for more information. ?What foods should I limit? ?This is a list of foods that should be eaten rarely or only on special occasions. ?Fruits ?Fruit canned in syrup. ?Vegetables ?Deep-fried potatoes (french fries). ?Grains ?Prepackaged pasta or rice dishes. Prepackaged cereal with added sugar. Prepackaged snacks with added sugar. ?Meats and other proteins ?Beef. Pork. Lamb. Poultry with skin. Hot dogs. Bacon. ?Dairy ?Ice cream. Sour cream. Whole milk. ?Fats and oils ?Butter. Canola oil. Vegetable oil. Beef fat (tallow). Lard. ?Beverages ?Juice. Sugar-sweetened soft drinks. Beer. Liquor and spirits. ?Sweets and desserts ?Cookies. Cakes. Pies. Candy. ?Seasonings and condiments ?Mayonnaise. Pre-made sauces and marinades. ?The items listed above may not be a complete list of foods and beverages you should limit. Contact a dietitian for more information. ?Summary ?The Mediterranean diet includes both food and lifestyle choices. ?Eat a variety of fresh fruits and vegetables, beans, nuts, seeds, and whole grains. ?Limit the amount of red meat and sweets that you eat. ?If recommended by your health care provider, drink red wine in moderation.  This means 1 glass a day for nonpregnant women and 2 glasses a day for men. A glass of wine equals 5 oz (150 mL). ?This information is not intended to replace advice given to you by your health care provider. Make sure you discuss any questions you have with your health care provider. ?Document Revised: 08/31/2019 Document Reviewed: 06/28/2019 ?Elsevier Patient Education ? 2023 [employer] ? ?

## 2022-03-30 LAB — HEMOGLOBIN A1C
Est. average glucose Bld gHb Est-mCnc: 117 mg/dL
Hgb A1c MFr Bld: 5.7 % — ABNORMAL HIGH (ref 4.8–5.6)

## 2022-03-30 LAB — RENAL FUNCTION PANEL
Albumin: 4.4 g/dL (ref 3.8–4.9)
BUN/Creatinine Ratio: 11 (ref 9–20)
BUN: 15 mg/dL (ref 6–24)
CO2: 22 mmol/L (ref 20–29)
Calcium: 9.1 mg/dL (ref 8.7–10.2)
Chloride: 104 mmol/L (ref 96–106)
Creatinine, Ser: 1.31 mg/dL — ABNORMAL HIGH (ref 0.76–1.27)
Glucose: 90 mg/dL (ref 70–99)
Phosphorus: 2.9 mg/dL (ref 2.8–4.1)
Potassium: 3.9 mmol/L (ref 3.5–5.2)
Sodium: 143 mmol/L (ref 134–144)
eGFR: 65 mL/min/{1.73_m2} (ref >59)

## 2022-03-30 LAB — MICROALBUMIN, URINE: Microalbumin, Urine: 2343.3 ug/mL

## 2022-04-01 ENCOUNTER — Encounter: Payer: Self-pay | Admitting: Family Medicine

## 2022-05-11 ENCOUNTER — Encounter: Payer: Self-pay | Admitting: Family Medicine

## 2022-08-05 ENCOUNTER — Encounter: Payer: Self-pay | Admitting: Family Medicine

## 2022-09-29 ENCOUNTER — Encounter: Payer: Self-pay | Admitting: Family Medicine

## 2022-09-29 ENCOUNTER — Ambulatory Visit: Payer: 59 | Admitting: Family Medicine

## 2022-09-29 VITALS — BP 120/70 | HR 96 | Ht 70.0 in | Wt 252.0 lb

## 2022-09-29 DIAGNOSIS — M25512 Pain in left shoulder: Secondary | ICD-10-CM | POA: Diagnosis not present

## 2022-09-29 DIAGNOSIS — R7303 Prediabetes: Secondary | ICD-10-CM | POA: Diagnosis not present

## 2022-09-29 DIAGNOSIS — M2042 Other hammer toe(s) (acquired), left foot: Secondary | ICD-10-CM | POA: Diagnosis not present

## 2022-09-29 DIAGNOSIS — E782 Mixed hyperlipidemia: Secondary | ICD-10-CM | POA: Diagnosis not present

## 2022-09-29 MED ORDER — IBUPROFEN 600 MG PO TABS: 600.0000 mg | ORAL_TABLET | Freq: Three times a day (TID) | ORAL | 0 refills | Status: AC | PRN

## 2022-09-29 MED ORDER — ATORVASTATIN CALCIUM 10 MG PO TABS: 10.0000 mg | ORAL_TABLET | Freq: Every day | ORAL | 1 refills | Status: DC

## 2022-09-29 NOTE — Progress Notes (Signed)
Date:  09/29/2022   Name:  Richard Frost   DOB:  1971/03/01   MRN:  [id number]   Chief Complaint: Hyperlipidemia, Prediabetes, and Toe Pain  Hyperlipidemia This is a chronic problem. The current episode started more than 1 year ago. The problem is controlled. Recent lipid tests were reviewed and are normal. Exacerbating diseases include diabetes and obesity. He has no history of chronic renal disease, hypothyroidism, liver disease or nephrotic syndrome. Pertinent negatives include no chest pain, focal sensory loss, focal weakness, leg pain, myalgias or shortness of breath. Current antihyperlipidemic treatment includes statins. The current treatment provides moderate improvement of lipids.  Toe Pain   Diabetes He presents for his follow-up diabetic visit. Diabetes type: prediabetes. Pertinent negatives for diabetes include no chest pain, no fatigue, no polydipsia, no polyuria, no visual change and no weight loss. Symptoms are stable.  Shoulder Pain  The pain is present in the left shoulder. This is a new problem. The current episode started 1 to 4 weeks ago. The problem has been unchanged. The quality of the pain is described as aching. The pain is mild. Associated symptoms include a limited range of motion and stiffness. The symptoms are aggravated by activity (abduction). His past medical history is significant for diabetes.    Lab Results  Component Value Date   NA 143 03/29/2022   K 3.9 03/29/2022   CO2 22 03/29/2022   GLUCOSE 90 03/29/2022   BUN 15 03/29/2022   CREATININE 1.31 (H) 03/29/2022   CALCIUM 9.1 03/29/2022   EGFR 65 03/29/2022   GFRNONAA 68 05/07/2020   Lab Results  Component Value Date   CHOL 158 11/05/2021   HDL 61 11/05/2021   LDLCALC 75 11/05/2021   TRIG 124 11/05/2021   CHOLHDL 4.0 11/20/2018   Lab Results  Component Value Date   TSH 2.150 08/08/2015   Lab Results  Component Value Date   HGBA1C 5.7 (H) 03/29/2022   Lab Results  Component  Value Date   WBC 6.7 04/04/2017   HGB 14.1 04/04/2017   HCT 42.9 04/04/2017   MCV 90.9 04/04/2017   PLT 178 04/04/2017   Lab Results  Component Value Date   ALT 38 11/05/2021   AST 31 11/05/2021   ALKPHOS 90 11/05/2021   BILITOT 0.6 11/05/2021   Lab Results  Component Value Date   VD25OH 27.0 (L) 11/06/2015     Review of Systems  Constitutional:  Negative for fatigue and weight loss.  HENT:  Negative for trouble swallowing.   Eyes:  Negative for visual disturbance.  Respiratory:  Negative for chest tightness, shortness of breath and wheezing.   Cardiovascular:  Negative for chest pain and palpitations.  Gastrointestinal:  Negative for abdominal pain.  Endocrine: Negative for polydipsia and polyuria.  Musculoskeletal:  Positive for stiffness. Negative for myalgias.  Neurological:  Negative for focal weakness.    Patient Active Problem List   Diagnosis Date Noted   Colon cancer screening A999333   Biliary colic    Gallstones A999333   Fatty liver disease, nonalcoholic A999333   Vitamin D deficiency 08/11/2015   OCD (obsessive compulsive disorder) 08/08/2015   Allergic rhinitis 08/08/2015   Nephrolithiasis 08/08/2015   Prediabetes 08/08/2015   OSA treated with BiPAP 08/08/2015   Obesity, Class II, BMI 35-39.9, with comorbidity 08/08/2015    Allergies  Allergen Reactions   Septra [Sulfamethoxazole-Trimethoprim] Hives   Sulfa Antibiotics Hives    Past Surgical History:  Procedure Laterality Date   CHOLECYSTECTOMY  N/A 04/05/2017   Procedure: LAPAROSCOPIC CHOLECYSTECTOMY WITH INTRAOPERATIVE CHOLANGIOGRAM;  Surgeon: Florene Glen, MD;  Location: [facility] ORS;  Service: General;  Laterality: N/A;   COLONOSCOPY WITH PROPOFOL N/A 06/04/2020   Procedure: COLONOSCOPY WITH PROPOFOL;  Surgeon: Lin Landsman, MD;  Location: [facility] END[facility]COPY;  Service: Gastroenterology;  Laterality: N/A;   INTERNAL URETHROTOMY     remove kidney stone   LITHOTRIPSY      LITHOTRIPSY     x 7    Social History   Tobacco Use   Smoking status: Never   Smokeless tobacco: Never  Vaping Use   Vaping Use: Never used  Substance Use Topics   Alcohol use: Yes    Alcohol/week: 0.0 standard drinks of alcohol    Comment: 1 drink per month    Drug use: No     Medication list has been reviewed and updated.  Current Meds  Medication Sig   ARIPiprazole (ABILIFY) 15 MG tablet Take 15 mg by mouth at bedtime. CBC   atorvastatin (LIPITOR) 10 MG tablet Take 1 tablet (10 mg total) by mouth daily.   cetirizine (ZYRTEC) 10 MG tablet Take 10 mg by mouth at bedtime. otc   clonazePAM (KLONOPIN) 2 MG tablet Take 1 tablet by mouth at bedtime. cbc   docusate sodium (COLACE) 100 MG capsule Take 100 mg by mouth 2 (two) times daily.   fluvoxaMINE (LUVOX) 100 MG tablet Take 100 mg by mouth 2 (two) times daily. Cbc 158m in am and [id number]   Omega 3 1000 MG CAPS Take 1 capsule (1,000 mg total) by mouth 2 (two) times daily.   ONETOUCH VERIO test strip TEST BLOOD SUGAR ONCE DAILY       09/29/2022    7:57 AM 03/29/2022    7:57 AM 11/05/2021    8:10 AM 05/08/2021    8:22 AM  GAD 7 : Generalized Anxiety Score  Nervous, Anxious, on Edge 0 2 0 0  Control/stop worrying 0 0 0 0  Worry too much - different things 0 0 0 0  Trouble relaxing 0 1 0 0  Restless 0 1 0 0  Easily annoyed or irritable 0 0 0 0  Afraid - awful might happen 0 0 0 0  Total GAD 7 Score 0 4 0 0  Anxiety Difficulty Not difficult at all Somewhat difficult Not difficult at all        09/29/2022    7:57 AM 03/29/2022    7:57 AM 11/05/2021    8:10 AM  Depression screen PHQ 2/9  Decreased Interest 0 0 0  Down, Depressed, Hopeless 0 0 0  PHQ - 2 Score 0 0 0  Altered sleeping 0 1 0  Tired, decreased energy 0 1 0  Change in appetite 0 1 0  Feeling bad or failure about yourself  0 0 0  Trouble concentrating 0 0 0  Moving slowly or fidgety/restless 0 1 0  Suicidal thoughts 0 0 0  PHQ-9 Score 0 4 0  Difficult  doing work/chores Not difficult at all Somewhat difficult Not difficult at all    BP Readings from Last 3 Encounters:  09/29/22 120/70  03/29/22 110/70  11/05/21 110/70    Physical Exam Vitals and nursing note reviewed.  HENT:     Head: Normocephalic.     Right Ear: Tympanic membrane and external ear normal.     Left Ear: Tympanic membrane and external ear normal.     Nose: Nose normal.  Mouth/Throat:     Mouth: Mucous membranes are moist.  Eyes:     General: No scleral icterus.       Right eye: No discharge.        Left eye: No discharge.     Conjunctiva/sclera: Conjunctivae normal.     Pupils: Pupils are equal, round, and reactive to light.  Neck:     Thyroid: No thyromegaly.     Vascular: No JVD.     Trachea: No tracheal deviation.  Cardiovascular:     Rate and Rhythm: Normal rate and regular rhythm.     Heart sounds: Normal heart sounds. No murmur heard.    No friction rub. No gallop.  Pulmonary:     Effort: No respiratory distress.     Breath sounds: Normal breath sounds. No wheezing, rhonchi or rales.  Abdominal:     General: Bowel sounds are normal.     Palpations: Abdomen is soft. There is no mass.     Tenderness: There is no abdominal tenderness. There is no guarding or rebound.  Musculoskeletal:     Left shoulder: Tenderness present. Decreased range of motion.     Cervical back: Normal range of motion and neck supple.     Left foot: Deformity present.     Comments: Supraspinatis tendon tender  Feet:     Comments: Hammar left/right early hammar Lymphadenopathy:     Cervical: No cervical adenopathy.  Skin:    General: Skin is warm.     Findings: No rash.  Neurological:     Mental Status: He is alert and oriented to person, place, and time.     Cranial Nerves: No cranial nerve deficit.     Deep Tendon Reflexes: Reflexes are normal and symmetric.     Wt Readings from Last 3 Encounters:  09/29/22 252 lb (114.3 kg)  03/29/22 249 lb (112.9 kg)   11/05/21 257 lb (116.6 kg)    BP 120/70   Pulse 96   Ht 5' 10"$  (1.778 m)   Wt 252 lb (114.3 kg)   SpO2 97%   BMI 36.16 kg/m   Assessment and Plan:  1. Mixed hyperlipidemia Chronic.  Controlled.  Stable.  Continue atorvastatin 10 mg once a day.  Will check lipid panel for current level of LDL control. - atorvastatin (LIPITOR) 10 MG tablet; Take 1 tablet (10 mg total) by mouth daily.  Dispense: 90 tablet; Refill: 1 - Lipid Panel With LDL/HDL Ratio  2. Prediabetes Chronic.  Controlled.  Currently controlled with reducing concentrated sweets and limiting carbohydrates.  Will check A1c for level of control and continue monitoring with renal function panel and lipid panel. - Renal Function Panel - HgB A1c - Lipid Panel With LDL/HDL Ratio  3. Acute pain of left shoulder New onset.  Persistent.  Tenderness of the supraspinatus and somewhat of the biceps.  Limited range of motion with particularly abduction.  This may be an impingement we will start with ibuprofen 600 mg every 8-12 hours and if continued pain next step is referral to sports medicine. - ibuprofen (ADVIL) 600 MG tablet; Take 1 tablet (600 mg total) by mouth every 8 (eight) hours as needed.  Dispense: 30 tablet; Refill: 0  4. Hammer toe of second toe of left foot New onset toe just all of a sudden popped up which has a hammertoe appearance.  On the left.  The right also has some beginnings of hammertoe.  Patient has a nonexistent arch for the most part in  both feet.  Will refer to podiatry for evaluation and suggested therapy. - Ambulatory referral to Podiatry    Otilio Miu, MD

## 2022-09-30 LAB — RENAL FUNCTION PANEL
Albumin: 4.6 g/dL (ref 3.8–4.9)
BUN/Creatinine Ratio: 11 (ref 9–20)
BUN: 15 mg/dL (ref 6–24)
CO2: 23 mmol/L (ref 20–29)
Calcium: 9.3 mg/dL (ref 8.7–10.2)
Chloride: 103 mmol/L (ref 96–106)
Creatinine, Ser: 1.33 mg/dL — ABNORMAL HIGH (ref 0.76–1.27)
Glucose: 119 mg/dL — ABNORMAL HIGH (ref 70–99)
Phosphorus: 3.2 mg/dL (ref 2.8–4.1)
Potassium: 4.2 mmol/L (ref 3.5–5.2)
Sodium: 142 mmol/L (ref 134–144)
eGFR: 64 mL/min/{1.73_m2} (ref >59)

## 2022-09-30 LAB — LIPID PANEL WITH LDL/HDL RATIO
Cholesterol, Total: 166 mg/dL (ref 100–199)
HDL: 63 mg/dL (ref >39)
LDL Chol Calc (NIH): 86 mg/dL (ref 0–99)
LDL/HDL Ratio: 1.4 ratio (ref 0.0–3.6)
Triglycerides: 91 mg/dL (ref 0–149)
VLDL Cholesterol Cal: 17 mg/dL (ref 5–40)

## 2022-09-30 LAB — HEMOGLOBIN A1C
Est. average glucose Bld gHb Est-mCnc: 114 mg/dL
Hgb A1c MFr Bld: 5.6 % (ref 4.8–5.6)

## 2022-10-06 ENCOUNTER — Encounter: Payer: Self-pay | Admitting: Family Medicine

## 2022-10-25 ENCOUNTER — Encounter: Payer: Self-pay | Admitting: Family Medicine

## 2022-10-26 ENCOUNTER — Other Ambulatory Visit: Payer: Self-pay

## 2022-10-26 DIAGNOSIS — T148XXA Other injury of unspecified body region, initial encounter: Secondary | ICD-10-CM

## 2022-10-26 MED ORDER — MUPIROCIN 2 % EX OINT: 1.0000 | TOPICAL_OINTMENT | Freq: Two times a day (BID) | CUTANEOUS | 0 refills | Status: DC

## 2022-11-08 ENCOUNTER — Ambulatory Visit: Payer: 59 | Admitting: Family Medicine

## 2022-11-08 ENCOUNTER — Encounter: Payer: Self-pay | Admitting: Family Medicine

## 2022-11-08 VITALS — BP 120/70 | HR 84 | Ht 70.0 in | Wt 250.0 lb

## 2022-11-08 DIAGNOSIS — M5116 Intervertebral disc disorders with radiculopathy, lumbar region: Secondary | ICD-10-CM

## 2022-11-08 MED ORDER — CYCLOBENZAPRINE HCL 10 MG PO TABS: 10.0000 mg | ORAL_TABLET | Freq: Three times a day (TID) | ORAL | 1 refills | Status: DC | PRN

## 2022-11-08 MED ORDER — PREDNISONE 10 MG PO TABS: 10.0000 mg | ORAL_TABLET | Freq: Every day | ORAL | 0 refills | Status: DC

## 2022-11-08 MED ORDER — MELOXICAM 15 MG PO TABS: 15.0000 mg | ORAL_TABLET | Freq: Every day | ORAL | 0 refills | Status: DC

## 2022-11-08 NOTE — Patient Instructions (Signed)
Radicular Pain Radicular pain is a type of pain that spreads from your back or neck along a spinal nerve. Spinal nerves are nerves that leave the spinal cord and go to the muscles. Radicular pain is sometimes called radiculopathy, radiculitis, or a pinched nerve. When you have this type of pain, you may also have weakness, numbness, or tingling in the area of your body that is supplied by the nerve. The pain may feel sharp and burning. Depending on which spinal nerve is affected, the pain may occur in the: Neck area (cervical radicular pain). You may also feel pain, numbness, weakness, or tingling in the arms. Mid-spine area (thoracic radicular pain). You would feel this pain in the back and chest. This type is rare. Lower back area (lumbar radicular pain). You would feel this pain as low back pain. You may feel pain, numbness, weakness, or tingling in the buttocks or legs. Sciatica is a type of lumbar radicular pain that shoots down the back of the leg. Radicular pain occurs when one of the spinal nerves becomes irritated or squeezed (compressed). It is often caused by something pushing on a spinal nerve, such as one of the bones of the spine (vertebrae) or one of the round cushions between vertebrae (intervertebral disks). This can result from: An injury. Wear and tear or aging of a disk. The growth of a bone spur that pushes on the nerve. Radicular pain often goes away when you follow instructions from your health care provider for relieving pain at home. How is this treated? Treatment may depend on the cause of the condition and may include: Working with a physical therapist. Taking pain medicine. Applying heat or ice or both to the affected areas. Doing stretches to improve flexibility. Having surgery. This may be needed if other treatments do not help. Different types of surgery may be done depending on the cause of this condition. Follow these instructions at home: Managing pain     If  directed, put ice on the affected area. To do this: Put ice in a plastic bag. Place a towel between your skin and the bag. Leave the ice on for 20 minutes, 2-3 times a day. Remove the ice if your skin turns bright red. This is very important. If you cannot feel pain, heat, or cold, you have a greater risk of damage to the area. If directed, apply heat to the affected area as often as told by your health care provider. Use the heat source that your health care provider recommends, such as a moist heat pack or a heating pad. Place a towel between your skin and the heat source. Leave the heat on for 20-30 minutes. Remove the heat if your skin turns bright red. This is especially important if you are unable to feel pain, heat, or cold. You have a greater risk of getting burned. Activity Do not sit or rest in bed for long periods of time. Try to stay as active as possible. Ask your health care provider what type of exercise or activity is best for you. Avoid activities that make your pain worse, such as bending and lifting. You may have to avoid lifting. Ask your health care provider how much you can safely lift. Practice using proper technique when lifting items. Proper lifting technique involves bending your knees and rising up. Do strength and range-of-motion exercises only as told by your health care provider or physical therapist. General instructions Take over-the-counter and prescription medicines only as told by your   health care provider. Pay attention to any changes in your symptoms. Keep all follow-up visits. This is important. Contact a health care provider if: Your pain and other symptoms get worse. Your pain medicine is not helping. Your pain has not improved after a few weeks of home care. You have a fever. Get help right away if: You have severe pain, weakness, or numbness. You have difficulty with bladder or bowel control. Summary Radicular pain is a type of pain that spreads  from your back or neck along a spinal nerve. When you have radicular pain, you may also have weakness, numbness, or tingling in the area of your body that is supplied by the nerve. The pain may feel sharp or burning. Radicular pain may be treated with ice, heat, medicines, or physical therapy. This information is not intended to replace advice given to you by your health care provider. Make sure you discuss any questions you have with your health care provider. Document Revised: 01/29/2021 Document Reviewed: 01/29/2021 Elsevier Patient Education  2023 Elsevier Inc.  

## 2022-11-08 NOTE — Progress Notes (Signed)
Date:  11/08/2022   Name:  Richard Frost   DOB:  [birth year]   MRN:  [id number]   Chief Complaint: Back Pain (Feels like a spasm toward spine- started about 1- 2 weeks ago. Gets worse after driving long distance)  Back Pain This is a new problem. The current episode started 1 to 4 weeks ago (2 weeks). The problem occurs constantly. The problem has been gradually worsening since onset. The pain is present in the lumbar spine. The quality of the pain is described as aching. The pain does not radiate. The pain is at a severity of 5/10. The pain is moderate. The symptoms are aggravated by lying down. Pertinent negatives include no abdominal pain, bladder incontinence, bowel incontinence, chest pain, dysuria, fever, headaches, leg pain, numbness, paresis, paresthesias, perianal numbness, weakness or weight loss. He has tried NSAIDs for the symptoms.    Lab Results  Component Value Date   NA 142 09/29/2022   K 4.2 09/29/2022   CO2 23 09/29/2022   GLUCOSE 119 (H) 09/29/2022   BUN 15 09/29/2022   CREATININE 1.33 (H) 09/29/2022   CALCIUM 9.3 09/29/2022   EGFR 64 09/29/2022   GFRNONAA 68 05/07/2020   Lab Results  Component Value Date   CHOL 166 09/29/2022   HDL 63 09/29/2022   LDLCALC 86 09/29/2022   TRIG 91 09/29/2022   CHOLHDL 4.0 11/20/2018   Lab Results  Component Value Date   TSH 2.150 08/08/2015   Lab Results  Component Value Date   HGBA1C 5.6 09/29/2022   Lab Results  Component Value Date   WBC 6.7 04/04/2017   HGB 14.1 04/04/2017   HCT 42.9 04/04/2017   MCV 90.9 04/04/2017   PLT 178 04/04/2017   Lab Results  Component Value Date   ALT 38 11/05/2021   AST 31 11/05/2021   ALKPHOS 90 11/05/2021   BILITOT 0.6 11/05/2021   Lab Results  Component Value Date   VD25OH 27.0 (L) 11/06/2015     Review of Systems  Constitutional:  Negative for chills, fever and weight loss.  HENT:  Negative for drooling, ear discharge, ear pain and sore throat.   Respiratory:   Negative for cough, shortness of breath and wheezing.   Cardiovascular:  Negative for chest pain, palpitations and leg swelling.  Gastrointestinal:  Negative for abdominal pain, blood in stool, bowel incontinence, constipation, diarrhea and nausea.  Endocrine: Negative for polydipsia.  Genitourinary:  Negative for bladder incontinence, dysuria, frequency, hematuria and urgency.  Musculoskeletal:  Positive for back pain. Negative for myalgias and neck pain.  Skin:  Negative for rash.  Allergic/Immunologic: Negative for environmental allergies.  Neurological:  Negative for dizziness, weakness, numbness, headaches and paresthesias.  Hematological:  Does not bruise/bleed easily.  Psychiatric/Behavioral:  Negative for suicidal ideas. The patient is not nervous/anxious.     Patient Active Problem List   Diagnosis Date Noted   Colon cancer screening A999333   Biliary colic    Gallstones A999333   Fatty liver disease, nonalcoholic A999333   Vitamin D deficiency 08/11/2015   OCD (obsessive compulsive disorder) 08/08/2015   Allergic rhinitis 08/08/2015   Nephrolithiasis 08/08/2015   Prediabetes 08/08/2015   OSA treated with BiPAP 08/08/2015   Obesity, Class II, BMI 35-39.9, with comorbidity 08/08/2015    Allergies  Allergen Reactions   Septra [Sulfamethoxazole-Trimethoprim] Hives   Sulfa Antibiotics Hives    Past Surgical History:  Procedure Laterality Date   CHOLECYSTECTOMY N/A 04/05/2017   Procedure: LAPAROSCOPIC CHOLECYSTECTOMY WITH  INTRAOPERATIVE CHOLANGIOGRAM;  Surgeon: Florene Glen, MD;  Location: [facility] ORS;  Service: General;  Laterality: N/A;   COLONOSCOPY WITH PROPOFOL N/A 06/04/2020   Procedure: COLONOSCOPY WITH PROPOFOL;  Surgeon: Lin Landsman, MD;  Location: [facility] ENDOSCOPY;  Service: Gastroenterology;  Laterality: N/A;   INTERNAL URETHROTOMY     remove kidney stone   LITHOTRIPSY     LITHOTRIPSY     x 7    Social History   Tobacco Use   Smoking  status: Never   Smokeless tobacco: Never  Vaping Use   Vaping Use: Never used  Substance Use Topics   Alcohol use: Yes    Alcohol/week: 0.0 standard drinks of alcohol    Comment: 1 drink per month    Drug use: No     Medication list has been reviewed and updated.  No outpatient medications have been marked as taking for the 11/08/22 encounter (Office Visit) with Juline Patch, MD.       11/08/2022   10:29 AM 09/29/2022    7:57 AM 03/29/2022    7:57 AM 11/05/2021    8:10 AM  GAD 7 : Generalized Anxiety Score  Nervous, Anxious, on Edge 0 0 2 0  Control/stop worrying 0 0 0 0  Worry too much - different things 0 0 0 0  Trouble relaxing 0 0 1 0  Restless 0 0 1 0  Easily annoyed or irritable 0 0 0 0  Afraid - awful might happen 0 0 0 0  Total GAD 7 Score 0 0 4 0  Anxiety Difficulty Not difficult at all Not difficult at all Somewhat difficult Not difficult at all       11/08/2022   10:29 AM 09/29/2022    7:57 AM 03/29/2022    7:57 AM  Depression screen PHQ 2/9  Decreased Interest 0 0 0  Down, Depressed, Hopeless 0 0 0  PHQ - 2 Score 0 0 0  Altered sleeping 0 0 1  Tired, decreased energy 0 0 1  Change in appetite 0 0 1  Feeling bad or failure about yourself  0 0 0  Trouble concentrating 0 0 0  Moving slowly or fidgety/restless 0 0 1  Suicidal thoughts 0 0 0  PHQ-9 Score 0 0 4  Difficult doing work/chores Not difficult at all Not difficult at all Somewhat difficult    BP Readings from Last 3 Encounters:  11/08/22 120/70  09/29/22 120/70  03/29/22 110/70    Physical Exam HENT:     Right Ear: Tympanic membrane and ear canal normal.     Left Ear: Tympanic membrane and ear canal normal.  Cardiovascular:     Heart sounds: No murmur heard.    No friction rub. No gallop.  Pulmonary:     Breath sounds: No wheezing, rhonchi or rales.  Abdominal:     Tenderness: There is no abdominal tenderness.  Musculoskeletal:     Lumbar back: Spasms present. No swelling, tenderness or  bony tenderness. Decreased range of motion. Positive left straight leg raise test. Negative right straight leg raise test.     Wt Readings from Last 3 Encounters:  11/08/22 250 lb (113.4 kg)  09/29/22 252 lb (114.3 kg)  03/29/22 249 lb (112.9 kg)    BP 120/70   Pulse 84   Ht 5\' 10"  (1.778 m)   Wt 250 lb (113.4 kg)   SpO2 96%   BMI 35.87 kg/m   Assessment and Plan:  1. Lumbar disc disease  with radiculopathy New onset.  Persistent.  Relatively tolerable but not stable.  Exacerbated by bending twisting lifting and riding distances.  Patient has taken over the counter ibuprofen without relief.  I suspect patient has some degenerative disc disease with nerve root irritation.  Patient has been given cyclobenzaprine 10 mg nightly muscle spasm prednisone 10 mg once a day.  And we have also initiated with meloxicam 15 mg once a day with the discontinuance of ibuprofen.  Patient may take Tylenol over-the-counter for pain breakthrough.  Patient has been given information on proper bending twisting lifting techniques. - cyclobenzaprine (FLEXERIL) 10 MG tablet; Take 1 tablet (10 mg total) by mouth 3 (three) times daily as needed for muscle spasms.  Dispense: 30 tablet; Refill: 1 - predniSONE (DELTASONE) 10 MG tablet; Take 1 tablet (10 mg total) by mouth daily with breakfast.  Dispense: 30 tablet; Refill: 0 - meloxicam (MOBIC) 15 MG tablet; Take 1 tablet (15 mg total) by mouth daily.  Dispense: 30 tablet; Refill: 0    Otilio Miu, MD

## 2022-11-15 ENCOUNTER — Other Ambulatory Visit: Payer: Self-pay | Admitting: Family Medicine

## 2022-11-15 DIAGNOSIS — M5116 Intervertebral disc disorders with radiculopathy, lumbar region: Secondary | ICD-10-CM

## 2022-11-18 ENCOUNTER — Other Ambulatory Visit: Payer: Self-pay

## 2022-11-18 ENCOUNTER — Telehealth: Payer: Self-pay | Admitting: Family Medicine

## 2022-11-18 DIAGNOSIS — M5116 Intervertebral disc disorders with radiculopathy, lumbar region: Secondary | ICD-10-CM

## 2022-11-18 NOTE — Telephone Encounter (Signed)
Copied from CRM [id number]. Topic: General - Other >> Nov 18, 2022  2:11 PM [place] B wrote: Reason for CRM: Patient called in says wants to drop off paperwork to be approved to work from home becaue of his back, and was told by Dr that he shouldn't be driving ot work with his back pain condition. Please call back if he can drop off or does he need an appt to have done. It needs to be done within 10 days

## 2022-11-23 ENCOUNTER — Ambulatory Visit
Admission: RE | Admit: 2022-11-23 | Discharge: 2022-11-23 | Disposition: A | Discharge status: Home / Self Care | Payer: 59 | Attending: Family Medicine | Admitting: Family Medicine

## 2022-11-23 ENCOUNTER — Encounter: Payer: Self-pay | Admitting: Family Medicine

## 2022-11-23 ENCOUNTER — Ambulatory Visit
Admission: RE | Admit: 2022-11-23 | Discharge: 2022-11-23 | Disposition: A | Discharge status: Home / Self Care | Payer: 59 | Source: Ambulatory Visit | Attending: Family Medicine | Admitting: Family Medicine

## 2022-11-23 ENCOUNTER — Ambulatory Visit: Payer: 59 | Admitting: Family Medicine

## 2022-11-23 VITALS — BP 112/78 | HR 88 | Ht 70.0 in | Wt 248.0 lb

## 2022-11-23 DIAGNOSIS — M5116 Intervertebral disc disorders with radiculopathy, lumbar region: Secondary | ICD-10-CM | POA: Diagnosis present

## 2022-11-23 DIAGNOSIS — M47817 Spondylosis without myelopathy or radiculopathy, lumbosacral region: Secondary | ICD-10-CM | POA: Diagnosis not present

## 2022-11-23 MED ORDER — MELOXICAM 15 MG PO TABS
15.0000 mg | ORAL_TABLET | Freq: Every day | ORAL | 0 refills | Status: DC | PRN
Start: 2022-11-23 — End: 2023-01-31

## 2022-11-23 NOTE — Progress Notes (Signed)
     Primary Care / Sports Medicine Office Visit  Patient Information:  Patient ID: Richard Frost, male DOB: [birth year] Age: 52 y.o. MRN: [id number]   Richard Frost is a pleasant 52 y.o. male presenting with the following:  Chief Complaint  Patient presents with   Back Pain    Vitals:   11/23/22 1327  BP: 112/78  Pulse: 88  SpO2: 97%   Vitals:   11/23/22 1327  Weight: 248 lb (112.5 kg)  Height:  (1.778 m)   Body mass index is 35.58 kg/m.     Independent interpretation of notes and tests performed by another provider:   Independent interpretation lumbar spine x-rays demonstrates multilevel intervertebral narrowing, subtle anterolisthesis and prominent anterior endplate osteophyte formation primarily stemming off of L4, no acute osseous process identified.  Procedures performed:   None  Pertinent History, Exam, Impression, and Recommendations:   Taryn was seen today for back pain.  Spondylosis of lumbosacral region without myelopathy or radiculopathy Overview: Onset mid March 2024  Assessment & Plan: Atraumatic onset of low back pain mid March 2024, presented to PCP Dr. Yetta Barre 11/08/2022 and placed on prednisone, meloxicam, cyclobenzaprine.  Denies any prior chronic history of similar symptoms, possible overuse while fixing a toilet weeks preceding symptoms.  Denies any radiating symptoms, no bowel/bladder dysfunction.  Feeling improved since onset, continues to dose prednisone and meloxicam, as needed cyclobenzaprine.  Examination essentially benign with subtle decreased flexibility throughout the right hamstring, otherwise negative provocative testing and he is sensorimotor intact bilateral lower extremities.  X-rays do demonstrate multilevel degenerative changes which can account for his stated history.  At this stage I have advised him to gradually wean from medications as follows:  - Stop prednisone - Continue meloxicam daily x 2 weeks  (take with food) - After 2 weeks transition to meloxicam daily as needed and start home exercise with information provided - Can continue cyclobenzaprine (muscle relaxer as needed) - Contact our office for any issues progressing through the above steps - If noted, formal PT, medication management, advanced imaging to be considered  Orders: -     Meloxicam; Take 1 tablet (15 mg total) by mouth daily as needed for pain.  Dispense: 30 tablet; Refill: 0     Orders & Medications Meds ordered this encounter  Medications   meloxicam (MOBIC) 15 MG tablet    Sig: Take 1 tablet (15 mg total) by mouth daily as needed for pain.    Dispense:  30 tablet    Refill:  0   No orders of the defined types were placed in this encounter.    No follow-ups on file.     Jerrol Banana, MD, [facility]   Primary Care Sports Medicine Primary Care and Sports Medicine at [facility]

## 2022-11-23 NOTE — Patient Instructions (Signed)
-   Stop prednisone - Continue meloxicam daily x 2 weeks (take with food) - After 2 weeks transition to meloxicam daily as needed and start home exercise with information provided - Can continue cyclobenzaprine (muscle relaxer as needed) - Contact our office for any issues progressing through the above steps

## 2022-11-24 DIAGNOSIS — M47817 Spondylosis without myelopathy or radiculopathy, lumbosacral region: Secondary | ICD-10-CM | POA: Insufficient documentation

## 2022-11-24 NOTE — Assessment & Plan Note (Addendum)
Atraumatic onset of low back pain mid March 2024, presented to PCP Dr. Yetta Barre 11/08/2022 and placed on prednisone, meloxicam, cyclobenzaprine.  Denies any prior chronic history of similar symptoms, possible overuse while fixing a toilet weeks preceding symptoms.  Denies any radiating symptoms, no bowel/bladder dysfunction.  Feeling improved since onset, continues to dose prednisone and meloxicam, as needed cyclobenzaprine.  Examination essentially benign with subtle decreased flexibility throughout the right hamstring, otherwise negative provocative testing and he is sensorimotor intact bilateral lower extremities.  X-rays do demonstrate multilevel degenerative changes which can account for his stated history.  At this stage I have advised him to gradually wean from medications as follows:  - Stop prednisone - Continue meloxicam daily x 2 weeks (take with food) - After 2 weeks transition to meloxicam daily as needed and start home exercise with information provided - Can continue cyclobenzaprine (muscle relaxer as needed) - Contact our office for any issues progressing through the above steps - If noted, formal PT, medication management, advanced imaging to be considered

## 2022-11-25 ENCOUNTER — Encounter: Payer: Self-pay | Admitting: Family Medicine

## 2022-11-26 NOTE — Telephone Encounter (Signed)
Please advise 

## 2023-01-26 ENCOUNTER — Encounter: Payer: Self-pay | Admitting: Family Medicine

## 2023-01-31 ENCOUNTER — Other Ambulatory Visit
Admission: RE | Admit: 2023-01-31 | Discharge: 2023-01-31 | Disposition: A | Discharge status: Home / Self Care | Payer: 59 | Attending: Family Medicine | Admitting: Family Medicine

## 2023-01-31 ENCOUNTER — Ambulatory Visit: Payer: 59 | Admitting: Family Medicine

## 2023-01-31 ENCOUNTER — Encounter: Payer: Self-pay | Admitting: Family Medicine

## 2023-01-31 VITALS — BP 118/68 | HR 78 | Ht 70.0 in | Wt 261.0 lb

## 2023-01-31 DIAGNOSIS — Z23 Encounter for immunization: Secondary | ICD-10-CM | POA: Diagnosis not present

## 2023-01-31 DIAGNOSIS — R7303 Prediabetes: Secondary | ICD-10-CM | POA: Diagnosis not present

## 2023-01-31 DIAGNOSIS — E118 Type 2 diabetes mellitus with unspecified complications: Secondary | ICD-10-CM | POA: Insufficient documentation

## 2023-01-31 LAB — HEMOGLOBIN A1C
Hgb A1c MFr Bld: 5.7 % — ABNORMAL HIGH (ref 4.8–5.6)
Mean Plasma Glucose: 116.89 mg/dL

## 2023-01-31 NOTE — Addendum Note (Signed)
Addended by: Everitt Amber on: 01/31/2023 09:15 AM   Modules accepted: Level of Service

## 2023-01-31 NOTE — Progress Notes (Signed)
Date:  01/31/2023   Name:  Richard Frost   DOB:  Apr 22, 1971   MRN:  [id number]   Chief Complaint: Prediabetes  Diabetes He presents for his follow-up diabetic visit. He has type 2 diabetes mellitus. His disease course has been stable. There are no hypoglycemic associated symptoms. Pertinent negatives for hypoglycemia include no dizziness, headaches or nervousness/anxiousness. Pertinent negatives for diabetes include no blurred vision, no chest pain, no foot paresthesias, no polydipsia, no polyphagia, no polyuria, no visual change, no weakness and no weight loss. There are no hypoglycemic complications. Symptoms are stable. There are no diabetic complications. Risk factors for coronary artery disease include dyslipidemia and hypertension. Current diabetic treatment includes diet. Meal planning includes avoidance of concentrated sweets and carbohydrate counting. He participates in exercise intermittently. His home blood glucose trend is fluctuating minimally. An ACE inhibitor/angiotensin II receptor blocker is not being taken.    Lab Results  Component Value Date   NA 142 09/29/2022   K 4.2 09/29/2022   CO2 23 09/29/2022   GLUCOSE 119 (H) 09/29/2022   BUN 15 09/29/2022   CREATININE 1.33 (H) 09/29/2022   CALCIUM 9.3 09/29/2022   EGFR 64 09/29/2022   GFRNONAA 68 05/07/2020   Lab Results  Component Value Date   CHOL 166 09/29/2022   HDL 63 09/29/2022   LDLCALC 86 09/29/2022   TRIG 91 09/29/2022   CHOLHDL 4.0 11/20/2018   Lab Results  Component Value Date   TSH 2.150 08/08/2015   Lab Results  Component Value Date   HGBA1C 5.6 09/29/2022   Lab Results  Component Value Date   WBC 6.7 04/04/2017   HGB 14.1 04/04/2017   HCT 42.9 04/04/2017   MCV 90.9 04/04/2017   PLT 178 04/04/2017   Lab Results  Component Value Date   ALT 38 11/05/2021   AST 31 11/05/2021   ALKPHOS 90 11/05/2021   BILITOT 0.6 11/05/2021   Lab Results  Component Value Date   VD25OH 27.0 (L)  11/06/2015     Review of Systems  Constitutional:  Negative for chills, fever and weight loss.  HENT:  Negative for drooling, ear discharge, ear pain, postnasal drip, rhinorrhea and sore throat.   Eyes:  Negative for blurred vision.  Respiratory:  Negative for cough, shortness of breath and wheezing.   Cardiovascular:  Negative for chest pain, palpitations and leg swelling.  Gastrointestinal:  Negative for abdominal pain, blood in stool, constipation, diarrhea and nausea.  Endocrine: Negative for polydipsia, polyphagia and polyuria.  Genitourinary:  Negative for dysuria, frequency, hematuria and urgency.  Musculoskeletal:  Negative for back pain, myalgias and neck pain.  Skin:  Negative for rash.  Allergic/Immunologic: Negative for environmental allergies.  Neurological:  Negative for dizziness, weakness and headaches.  Hematological:  Does not bruise/bleed easily.  Psychiatric/Behavioral:  Negative for suicidal ideas. The patient is not nervous/anxious.     Patient Active Problem List   Diagnosis Date Noted   Spondylosis of lumbosacral region without myelopathy or radiculopathy 11/24/2022   Colon cancer screening 05/14/2020   Biliary colic    Gallstones 08/19/2015   Fatty liver disease, nonalcoholic 08/19/2015   Vitamin D deficiency 08/11/2015   OCD (obsessive compulsive disorder) 08/08/2015   Allergic rhinitis 08/08/2015   Nephrolithiasis 08/08/2015   Prediabetes 08/08/2015   OSA treated with BiPAP 08/08/2015   Obesity, Class II, BMI 35-39.9, with comorbidity 08/08/2015    Allergies  Allergen Reactions   Sulfa Antibiotics Hives   Septra [Sulfamethoxazole-Trimethoprim] Hives  Past Surgical History:  Procedure Laterality Date   CHOLECYSTECTOMY N/A 04/05/2017   Procedure: LAPAROSCOPIC CHOLECYSTECTOMY WITH INTRAOPERATIVE CHOLANGIOGRAM;  Surgeon: Lattie Haw, MD;  Location: [facility] ORS;  Service: General;  Laterality: N/A;   COLONOSCOPY WITH PROPOFOL N/A 06/04/2020    Procedure: COLONOSCOPY WITH PROPOFOL;  Surgeon: Toney Reil, MD;  Location: [facility] ENDOSCOPY;  Service: Gastroenterology;  Laterality: N/A;   INTERNAL URETHROTOMY     remove kidney stone   LITHOTRIPSY     LITHOTRIPSY     x 7    Social History   Tobacco Use   Smoking status: Never   Smokeless tobacco: Never  Vaping Use   Vaping Use: Never used  Substance Use Topics   Alcohol use: Yes    Alcohol/week: 0.0 standard drinks of alcohol    Comment: 1 drink per month    Drug use: No     Medication list has been reviewed and updated.  No outpatient medications have been marked as taking for the 01/31/23 encounter (Office Visit) with Duanne Limerick, MD.       01/31/2023    8:33 AM 11/23/2022    1:33 PM 11/08/2022   10:29 AM 09/29/2022    7:57 AM  GAD 7 : Generalized Anxiety Score  Nervous, Anxious, on Edge 0 1 0 0  Control/stop worrying 0 1 0 0  Worry too much - different things 0 1 0 0  Trouble relaxing 0 0 0 0  Restless 0 0 0 0  Easily annoyed or irritable 0 0 0 0  Afraid - awful might happen 0 0 0 0  Total GAD 7 Score 0 3 0 0  Anxiety Difficulty Not difficult at all Not difficult at all Not difficult at all Not difficult at all       01/31/2023    8:33 AM 11/23/2022    1:33 PM 11/08/2022   10:29 AM  Depression screen PHQ 2/9  Decreased Interest 0 0 0  Down, Depressed, Hopeless 0 0 0  PHQ - 2 Score 0 0 0  Altered sleeping 0 0 0  Tired, decreased energy 0 0 0  Change in appetite 0 0 0  Feeling bad or failure about yourself  0 0 0  Trouble concentrating 0 0 0  Moving slowly or fidgety/restless 0 0 0  Suicidal thoughts 0 0 0  PHQ-9 Score 0 0 0  Difficult doing work/chores Not difficult at all Not difficult at all Not difficult at all    BP Readings from Last 3 Encounters:  01/31/23 118/68  11/23/22 112/78  11/08/22 120/70    Physical Exam Vitals and nursing note reviewed.  HENT:     Head: Normocephalic.     Right Ear: Tympanic membrane and external ear  normal.     Left Ear: Tympanic membrane and external ear normal.     Nose: Nose normal.     Mouth/Throat:     Mouth: Mucous membranes are moist.  Eyes:     General: No scleral icterus.       Right eye: No discharge.        Left eye: No discharge.     Conjunctiva/sclera: Conjunctivae normal.     Pupils: Pupils are equal, round, and reactive to light.  Neck:     Thyroid: No thyromegaly.     Vascular: No JVD.     Trachea: No tracheal deviation.  Cardiovascular:     Rate and Rhythm: Normal rate and regular rhythm.  Heart sounds: Normal heart sounds. No murmur heard.    No friction rub. No gallop.  Pulmonary:     Effort: No respiratory distress.     Breath sounds: Normal breath sounds. No wheezing, rhonchi or rales.  Abdominal:     General: Bowel sounds are normal.     Palpations: Abdomen is soft. There is no mass.     Tenderness: There is no abdominal tenderness. There is no guarding or rebound.  Musculoskeletal:        General: No tenderness. Normal range of motion.     Cervical back: Normal range of motion and neck supple.  Lymphadenopathy:     Cervical: No cervical adenopathy.  Skin:    General: Skin is warm.     Findings: No rash.  Neurological:     Mental Status: He is alert.     Deep Tendon Reflexes: Reflexes are normal and symmetric.     Wt Readings from Last 3 Encounters:  01/31/23 261 lb (118.4 kg)  11/23/22 248 lb (112.5 kg)  11/08/22 250 lb (113.4 kg)    BP 118/68   Pulse 78   Ht 5\' 10"  (1.778 m)   Wt 261 lb (118.4 kg)   SpO2 98%   BMI 37.45 kg/m   Assessment and Plan: 1. Prediabetes Chronic.  Diet controlled.  Stable.  Previous A1c's have been in the upper fives to mid fives.  This indicates excellent control we will continue with dietary approach and that we have reemphasized eliminating concentrated sweets and monitoring of carbohydrates.  Will obtain A1c and microalbuminuria for follow-up of patient's diabetic parameters.  2. Need for shingles  vaccine Discussed with patient and administered - Zoster Recombinant (Shingrix )       Elizabeth Sauer, MD

## 2023-02-01 LAB — MICROALBUMIN / CREATININE URINE RATIO
Creatinine, Urine: 90.3 mg/dL
Microalb Creat Ratio: <3 mg/g creat (ref 0–29)
Microalb, Ur: <3 ug/mL — ABNORMAL HIGH

## 2023-03-10 DIAGNOSIS — G4700 Insomnia, unspecified: Secondary | ICD-10-CM | POA: Insufficient documentation

## 2023-04-04 ENCOUNTER — Other Ambulatory Visit: Payer: Self-pay | Admitting: Family Medicine

## 2023-04-04 DIAGNOSIS — E782 Mixed hyperlipidemia: Secondary | ICD-10-CM

## 2023-04-22 ENCOUNTER — Telehealth: Payer: Self-pay | Admitting: Family Medicine

## 2023-04-22 NOTE — Telephone Encounter (Signed)
Please review.  KP

## 2023-04-22 NOTE — Telephone Encounter (Signed)
Pt is calling in because he is having an issues with falling out of the bed. Pt says it happened a few nights ago and his nightstand broke his fall but he is sore. Pt would like advice from Dr. Yetta Barre regarding what this could potentially be. Pt wants to know are there any tests that can be done and wants overall advice on the situation. Please follow up with pt.

## 2023-04-28 ENCOUNTER — Encounter: Payer: Self-pay | Admitting: Family Medicine

## 2023-04-28 ENCOUNTER — Ambulatory Visit: Payer: 59 | Admitting: Family Medicine

## 2023-04-28 VITALS — BP 122/68 | HR 88 | Wt 269.0 lb

## 2023-04-28 DIAGNOSIS — Z23 Encounter for immunization: Secondary | ICD-10-CM | POA: Diagnosis not present

## 2023-04-28 DIAGNOSIS — G4700 Insomnia, unspecified: Secondary | ICD-10-CM

## 2023-04-28 MED ORDER — HYDROXYZINE HCL 10 MG PO TABS: 10.0000 mg | ORAL_TABLET | Freq: Three times a day (TID) | ORAL | 0 refills | Status: DC | PRN

## 2023-04-28 NOTE — Progress Notes (Signed)
Date:  04/28/2023   Name:  Richard Frost   DOB:  August 28, 1970   MRN:  [id number]   Chief Complaint: Insomnia (Having dreams causing him to fall out of bed and hit nightstand. Would like to try something for sleep)  Insomnia Primary symptoms: no difficulty falling asleep, frequent awakening.   The onset quality is gradual. The problem occurs intermittently. The problem has been waxing and waning since onset. Exacerbated by: fallen out of bed/act out dreams. Improvement on treatment: pending.    Lab Results  Component Value Date   NA 142 09/29/2022   K 4.2 09/29/2022   CO2 23 09/29/2022   GLUCOSE 119 (H) 09/29/2022   BUN 15 09/29/2022   CREATININE 1.33 (H) 09/29/2022   CALCIUM 9.3 09/29/2022   EGFR 64 09/29/2022   GFRNONAA 68 05/07/2020   Lab Results  Component Value Date   CHOL 166 09/29/2022   HDL 63 09/29/2022   LDLCALC 86 09/29/2022   TRIG 91 09/29/2022   CHOLHDL 4.0 11/20/2018   Lab Results  Component Value Date   TSH 2.150 08/08/2015   Lab Results  Component Value Date   HGBA1C 5.7 (H) 01/31/2023   Lab Results  Component Value Date   WBC 6.7 04/04/2017   HGB 14.1 04/04/2017   HCT 42.9 04/04/2017   MCV 90.9 04/04/2017   PLT 178 04/04/2017   Lab Results  Component Value Date   ALT 38 11/05/2021   AST 31 11/05/2021   ALKPHOS 90 11/05/2021   BILITOT 0.6 11/05/2021   Lab Results  Component Value Date   VD25OH 27.0 (L) 11/06/2015     Review of Systems  Constitutional:  Negative for fatigue and unexpected weight change.  HENT:  Negative for trouble swallowing.   Eyes:  Negative for visual disturbance.  Respiratory:  Negative for chest tightness.   Cardiovascular:  Negative for chest pain and palpitations.  Gastrointestinal:  Negative for abdominal pain and anal bleeding.  Genitourinary:  Negative for hematuria.  Psychiatric/Behavioral:  The patient has insomnia.   All other systems reviewed and are negative.   Patient Active Problem List    Diagnosis Date Noted   Spondylosis of lumbosacral region without myelopathy or radiculopathy 11/24/2022   Colon cancer screening 05/14/2020   Biliary colic    Gallstones 08/19/2015   Fatty liver disease, nonalcoholic 08/19/2015   Vitamin D deficiency 08/11/2015   OCD (obsessive compulsive disorder) 08/08/2015   Allergic rhinitis 08/08/2015   Nephrolithiasis 08/08/2015   Prediabetes 08/08/2015   OSA treated with BiPAP 08/08/2015   Obesity, Class II, BMI 35-39.9, with comorbidity 08/08/2015    Allergies  Allergen Reactions   Sulfa Antibiotics Hives   Septra [Sulfamethoxazole-Trimethoprim] Hives    Past Surgical History:  Procedure Laterality Date   CHOLECYSTECTOMY N/A 04/05/2017   Procedure: LAPAROSCOPIC CHOLECYSTECTOMY WITH INTRAOPERATIVE CHOLANGIOGRAM;  Surgeon: Lattie Haw, MD;  Location: [facility] ORS;  Service: General;  Laterality: N/A;   COLONOSCOPY WITH PROPOFOL N/A 06/04/2020   Procedure: COLONOSCOPY WITH PROPOFOL;  Surgeon: Toney Reil, MD;  Location: [facility] END[facility];  Service: Gastroenterology;  Laterality: N/A;   INTERNAL URETHROTOMY     remove kidney stone   LITHOTRIPSY     LITHOTRIPSY     x 7    Social History   Tobacco Use   Smoking status: Never   Smokeless tobacco: Never  Vaping Use   Vaping status: Never Used  Substance Use Topics   Alcohol use: Yes    Alcohol/week: 0.0  standard drinks of alcohol    Comment: 1 drink per month    Drug use: No     Medication list has been reviewed and updated.  Current Meds  Medication Sig   ARIPiprazole (ABILIFY) 20 MG tablet Take 20 mg by mouth at bedtime as needed.   atorvastatin (LIPITOR) 10 MG tablet TAKE 1 TABLET BY MOUTH ONCE DAILY   cetirizine (ZYRTEC) 10 MG tablet Take 10 mg by mouth at bedtime. otc   clonazePAM (KLONOPIN) 2 MG tablet Take 1 tablet by mouth at bedtime. cbc   cyclobenzaprine (FLEXERIL) 10 MG tablet TAKE 1 TABLET BY MOUTH 3 TIMES DAILY AS NEEDED FOR MUSCLE SPASMS   docusate  sodium (COLACE) 100 MG capsule Take 100 mg by mouth 2 (two) times daily.   fluvoxaMINE (LUVOX) 100 MG tablet Take 100 mg by mouth 2 (two) times daily. Cbc 100mg  in am and 150mg  qhs   ibuprofen (ADVIL) 600 MG tablet Take 1 tablet (600 mg total) by mouth every 8 (eight) hours as needed.   mupirocin ointment (BACTROBAN) 2 % Apply 1 Application topically 2 (two) times daily.   Omega 3 1000 MG CAPS Take 1 capsule (1,000 mg total) by mouth 2 (two) times daily.   ONETOUCH VERIO test strip TEST BLOOD SUGAR ONCE DAILY   [DISCONTINUED] ARIPiprazole (ABILIFY) 15 MG tablet Take 15 mg by mouth at bedtime. CBC       04/28/2023    8:43 AM 01/31/2023    8:33 AM 11/23/2022    1:33 PM 11/08/2022   10:29 AM  GAD 7 : Generalized Anxiety Score  Nervous, Anxious, on Edge 1 0 1 0  Control/stop worrying 0 0 1 0  Worry too much - different things 0 0 1 0  Trouble relaxing 0 0 0 0  Restless 0 0 0 0  Easily annoyed or irritable 0 0 0 0  Afraid - awful might happen 0 0 0 0  Total GAD 7 Score 1 0 3 0  Anxiety Difficulty Not difficult at all Not difficult at all Not difficult at all Not difficult at all       04/28/2023    8:42 AM 01/31/2023    8:33 AM 11/23/2022    1:33 PM  Depression screen PHQ 2/9  Decreased Interest 0 0 0  Down, Depressed, Hopeless 0 0 0  PHQ - 2 Score 0 0 0  Altered sleeping 1 0 0  Tired, decreased energy 0 0 0  Change in appetite 0 0 0  Feeling bad or failure about yourself  0 0 0  Trouble concentrating 0 0 0  Moving slowly or fidgety/restless 0 0 0  Suicidal thoughts 0 0 0  PHQ-9 Score 1 0 0  Difficult doing work/chores Not difficult at all Not difficult at all Not difficult at all    BP Readings from Last 3 Encounters:  04/28/23 122/68  01/31/23 118/68  11/23/22 112/78    Physical Exam Vitals and nursing note reviewed.  HENT:     Head: Normocephalic.     Right Ear: External ear normal.     Left Ear: External ear normal.     Nose: Nose normal.  Eyes:      Conjunctiva/sclera: Conjunctivae normal.     Pupils: Pupils are equal, round, and reactive to light.  Neck:     Thyroid: No thyromegaly.     Vascular: No JVD.     Trachea: No tracheal deviation.  Cardiovascular:     Rate and  Rhythm: Normal rate and regular rhythm.     Heart sounds: Normal heart sounds. No murmur heard.    No friction rub. No gallop.  Pulmonary:     Effort: No respiratory distress.     Breath sounds: Normal breath sounds. No wheezing, rhonchi or rales.  Abdominal:     General: Bowel sounds are normal.     Palpations: Abdomen is soft.     Tenderness: There is no abdominal tenderness.  Musculoskeletal:     Cervical back: Neck supple.  Lymphadenopathy:     Cervical: No cervical adenopathy.  Skin:    General: Skin is warm.  Neurological:     Mental Status: He is alert.     Wt Readings from Last 3 Encounters:  04/28/23 269 lb (122 kg)  01/31/23 261 lb (118.4 kg)  11/23/22 248 lb (112.5 kg)    BP 122/68   Pulse 88   Wt 269 lb (122 kg)   SpO2 95%   BMI 38.60 kg/m   Assessment and Plan: 1. Insomnia, unspecified type New onset.  Episodic.  Since increasing the dosing of Abilify patient is having vivid dreams that he is acting out to the point that he fell out of bed and bruised himself.  I suspect this this is not a coincidence and that they are likely to be related.  If he is to continue present dosing may be a candidate and has to sleep with Atarax 10 mg rather than using trazodone or mirtazapine.  I have also suggested a portable railing that he can sleep underneath the [place] and mattress.  2. Need for shingles vaccine Discussed and administered - Zoster Recombinant (Shingrix )     Elizabeth Sauer, MD

## 2023-05-03 ENCOUNTER — Ambulatory Visit: Payer: 59

## 2023-05-25 ENCOUNTER — Other Ambulatory Visit: Payer: Self-pay | Admitting: Family Medicine

## 2023-05-25 NOTE — Telephone Encounter (Signed)
Requested Prescriptions  Pending Prescriptions Disp Refills   hydrOXYzine (ATARAX) 10 MG tablet [Pharmacy Med Name: HYDROXYZINE HCL 10 MG TAB] 30 tablet 0    Sig: TAKE 1 TABLET BY MOUTH 3 TIMES DAILY AS NEEDED     Ear, Nose, and Throat:  Antihistamines 2 Failed - 05/25/2023  8:38 AM      Failed - Cr in normal range and within 360 days    Creatinine, Ser  Date Value Ref Range Status  09/29/2022 1.33 (H) 0.76 - 1.27 mg/dL Final         Passed - Valid encounter within last 12 months    Recent Outpatient Visits           3 weeks ago Insomnia, unspecified type   [place] [facility], [facility]   3 months ago Prediabetes   [facility] Inches, MD   6 months ago Spondylosis of lumbosacral region without myelopathy or radiculopathy   [place] [facility], Ocie Bob, MD   6 months ago Lumbar disc disease with radiculopathy   [facility] In[facility], MD   7 months ago Mixed hyperlipidemia   [facility] at [facility] Inches, MD       Future Appointments             In 2 weeks Duanne Limerick, MD [facility] at [facility], [facility]

## 2023-06-01 ENCOUNTER — Other Ambulatory Visit: Payer: Self-pay | Admitting: Family Medicine

## 2023-06-01 DIAGNOSIS — E782 Mixed hyperlipidemia: Secondary | ICD-10-CM

## 2023-06-05 ENCOUNTER — Encounter: Payer: Self-pay | Admitting: Family Medicine

## 2023-06-14 ENCOUNTER — Ambulatory Visit: Payer: 59 | Admitting: Family Medicine

## 2023-06-14 ENCOUNTER — Encounter: Payer: Self-pay | Admitting: Family Medicine

## 2023-06-14 VITALS — BP 108/76 | HR 84 | Ht 70.0 in | Wt 268.0 lb

## 2023-06-14 DIAGNOSIS — F5101 Primary insomnia: Secondary | ICD-10-CM

## 2023-06-14 DIAGNOSIS — E782 Mixed hyperlipidemia: Secondary | ICD-10-CM | POA: Diagnosis not present

## 2023-06-14 MED ORDER — HYDROXYZINE HCL 10 MG PO TABS: 10.0000 mg | ORAL_TABLET | Freq: Every evening | ORAL | 1 refills | Status: DC | PRN

## 2023-06-14 MED ORDER — ATORVASTATIN CALCIUM 10 MG PO TABS: 10.0000 mg | ORAL_TABLET | Freq: Every day | ORAL | 1 refills | Status: DC

## 2023-06-14 NOTE — Progress Notes (Signed)
Date:  06/14/2023   Name:  Richard Frost   DOB:  [birth year]   MRN:  [id number]   Chief Complaint: Hyperlipidemia and Anxiety  Hyperlipidemia This is a chronic problem. The current episode started more than 1 year ago. The problem is controlled. Recent lipid tests were reviewed and are normal. He has no history of chronic renal disease, diabetes, hypothyroidism or liver disease. Pertinent negatives include no chest pain, focal sensory loss, focal weakness or shortness of breath. Current antihyperlipidemic treatment includes statins. The current treatment provides moderate improvement of lipids. There are no compliance problems.  Risk factors for coronary artery disease include dyslipidemia.  Anxiety Presents for follow-up visit. Symptoms include excessive worry, insomnia, nervous/anxious behavior and restlessness. Patient reports no chest pain, dizziness, palpitations or shortness of breath.    Insomnia Primary symptoms: difficulty falling asleep.   The current episode started more than one year. The problem has been gradually improving since onset. The symptoms are relieved by medication (hydroxyzine). The treatment provided moderate relief.  Diabetes He presents for his follow-up diabetic visit. Diabetes type: prediabetes. Hypoglycemia symptoms include nervousness/anxiousness. Pertinent negatives for hypoglycemia include no dizziness or headaches. Pertinent negatives for diabetes include no chest pain, no polydipsia and no polyuria. There are no hypoglycemic complications. Symptoms are stable. There are no diabetic complications. Risk factors for coronary artery disease include dyslipidemia. Current diabetic treatment includes diet.    Lab Results  Component Value Date   NA 142 09/29/2022   K 4.2 09/29/2022   CO2 23 09/29/2022   GLUCOSE 119 (H) 09/29/2022   BUN 15 09/29/2022   CREATININE 1.33 (H) 09/29/2022   CALCIUM 9.3 09/29/2022   EGFR 64 09/29/2022   GFRNONAA 68  05/07/2020   Lab Results  Component Value Date   CHOL 166 09/29/2022   HDL 63 09/29/2022   LDLCALC 86 09/29/2022   TRIG 91 09/29/2022   CHOLHDL 4.0 11/20/2018   Lab Results  Component Value Date   TSH 2.150 08/08/2015   Lab Results  Component Value Date   HGBA1C 5.7 (H) 01/31/2023   Lab Results  Component Value Date   WBC 6.7 04/04/2017   HGB 14.1 04/04/2017   HCT 42.9 04/04/2017   MCV 90.9 04/04/2017   PLT 178 04/04/2017   Lab Results  Component Value Date   ALT 38 11/05/2021   AST 31 11/05/2021   ALKPHOS 90 11/05/2021   BILITOT 0.6 11/05/2021   Lab Results  Component Value Date   VD25OH 27.0 (L) 11/06/2015     Review of Systems  HENT:  Negative for congestion.   Eyes:  Negative for visual disturbance.  Respiratory:  Negative for chest tightness, shortness of breath and wheezing.   Cardiovascular:  Negative for chest pain, palpitations and leg swelling.  Gastrointestinal:  Negative for abdominal distention, blood in stool and constipation.  Endocrine: Negative for polydipsia and polyuria.  Neurological:  Negative for dizziness, focal weakness and headaches.  Psychiatric/Behavioral:  The patient is nervous/anxious and has insomnia.     Patient Active Problem List   Diagnosis Date Noted   Spondylosis of lumbosacral region without myelopathy or radiculopathy 11/24/2022   Colon cancer screening 05/14/2020   Biliary colic    Gallstones 08/19/2015   Fatty liver disease, nonalcoholic 08/19/2015   Vitamin D deficiency 08/11/2015   OCD (obsessive compulsive disorder) 08/08/2015   Allergic rhinitis 08/08/2015   Nephrolithiasis 08/08/2015   Prediabetes 08/08/2015   OSA treated with BiPAP 08/08/2015   Obesity, Class  II, BMI 35-39.9, with comorbidity 08/08/2015    Allergies  Allergen Reactions   Sulfa Antibiotics Hives   Septra [Sulfamethoxazole-Trimethoprim] Hives    Past Surgical History:  Procedure Laterality Date   CHOLECYSTECTOMY N/A 04/05/2017    Procedure: LAPAROSCOPIC CHOLECYSTECTOMY WITH INTRAOPERATIVE CHOLANGIOGRAM;  Surgeon: Lattie Haw, MD;  Location: [facility];  Service: General;  Laterality: N/A;   COLONOSCOPY WITH PROPOFOL N/A 06/04/2020   Procedure: COLONOSCOPY WITH PROPOFOL;  Surgeon: Toney Reil, MD;  Location: [facility];  Service: Gastroenterology;  Laterality: N/A;   INTERNAL URETHROTOMY     remove kidney stone   LITHOTRIPSY     LITHOTRIPSY     x 7    Social History   Tobacco Use   Smoking status: Never   Smokeless tobacco: Never  Vaping Use   Vaping status: Never Used  Substance Use Topics   Alcohol use: Yes    Alcohol/week: 0.0 standard drinks of alcohol    Comment: 1 drink per month    Drug use: No     Medication list has been reviewed and updated.  Current Meds  Medication Sig   ARIPiprazole (ABILIFY) 20 MG tablet Take 20 mg by mouth at bedtime as needed.   atorvastatin (LIPITOR) 10 MG tablet TAKE 1 TABLET BY MOUTH ONCE DAILY   cetirizine (ZYRTEC) 10 MG tablet Take 10 mg by mouth at bedtime. otc   clonazePAM (KLONOPIN) 2 MG tablet Take 1 tablet by mouth at bedtime. cbc   cyclobenzaprine (FLEXERIL) 10 MG tablet TAKE 1 TABLET BY MOUTH 3 TIMES DAILY AS NEEDED FOR MUSCLE SPASMS   docusate sodium (COLACE) 100 MG capsule Take 100 mg by mouth 2 (two) times daily.   fluvoxaMINE (LUVOX) 100 MG tablet Take 100 mg by mouth 2 (two) times daily. Cbc 100mg  in am and 150mg  qhs   hydrOXYzine (ATARAX) 10 MG tablet TAKE 1 TABLET BY MOUTH 3 TIMES DAILY AS NEEDED   ibuprofen (ADVIL) 600 MG tablet Take 1 tablet (600 mg total) by mouth every 8 (eight) hours as needed.   Omega 3 1000 MG CAPS Take 1 capsule (1,000 mg total) by mouth 2 (two) times daily.   [DISCONTINUED] ONETOUCH VERIO test strip TEST BLOOD SUGAR ONCE DAILY       06/14/2023    8:16 AM 04/28/2023    8:43 AM 01/31/2023    8:33 AM 11/23/2022    1:33 PM  GAD 7 : Generalized Anxiety Score  Nervous, Anxious, on Edge 3 1 0 1  Control/stop  worrying 1 0 0 1  Worry too much - different things 1 0 0 1  Trouble relaxing 1 0 0 0  Restless 1 0 0 0  Easily annoyed or irritable 0 0 0 0  Afraid - awful might happen 0 0 0 0  Total GAD 7 Score 7 1 0 3  Anxiety Difficulty Not difficult at all Not difficult at all Not difficult at all Not difficult at all       06/14/2023    8:16 AM 04/28/2023    8:42 AM 01/31/2023    8:33 AM  Depression screen PHQ 2/9  Decreased Interest 3 0 0  Down, Depressed, Hopeless 0 0 0  PHQ - 2 Score 3 0 0  Altered sleeping 1 1 0  Tired, decreased energy 1 0 0  Change in appetite 1 0 0  Feeling bad or failure about yourself  0 0 0  Trouble concentrating 1 0 0  Moving slowly or fidgety/restless  3 0 0  Suicidal thoughts 0 0 0  PHQ-9 Score 10 1 0  Difficult doing work/chores Not difficult at all Not difficult at all Not difficult at all    BP Readings from Last 3 Encounters:  06/14/23 108/76  04/28/23 122/68  01/31/23 118/68    Physical Exam HENT:     Head: Normocephalic.     Right Ear: Tympanic membrane and external ear normal.     Left Ear: Tympanic membrane and external ear normal.     Nose: Nose normal.     Mouth/Throat:     Mouth: Mucous membranes are moist.  Eyes:     General: No scleral icterus.       Right eye: No discharge.        Left eye: No discharge.     Conjunctiva/sclera: Conjunctivae normal.     Pupils: Pupils are equal, round, and reactive to light.  Neck:     Thyroid: No thyromegaly.     Vascular: No JVD.     Trachea: No tracheal deviation.  Cardiovascular:     Rate and Rhythm: Normal rate and regular rhythm.     Heart sounds: Normal heart sounds. No murmur heard.    No friction rub. No gallop.  Pulmonary:     Effort: No respiratory distress.     Breath sounds: Normal breath sounds. No wheezing, rhonchi or rales.  Abdominal:     General: Bowel sounds are normal.     Palpations: Abdomen is soft. There is no mass.     Tenderness: There is no abdominal tenderness.  There is no guarding or rebound.  Musculoskeletal:        General: No tenderness. Normal range of motion.     Cervical back: Normal range of motion and neck supple.  Lymphadenopathy:     Cervical: No cervical adenopathy.  Skin:    General: Skin is warm.     Findings: No rash.  Neurological:     Mental Status: He is alert and oriented to person, place, and time.     Cranial Nerves: No cranial nerve deficit.     Deep Tendon Reflexes: Reflexes are normal and symmetric.     Wt Readings from Last 3 Encounters:  06/14/23 268 lb (121.6 kg)  04/28/23 269 lb (122 kg)  01/31/23 261 lb (118.4 kg)    BP 108/76   Pulse 84   Ht 5\' 10"  (1.778 m)   Wt 268 lb (121.6 kg)   SpO2 97%   BMI 38.45 kg/m   Assessment and Plan: 1. Mixed hyperlipidemia Chronic.  Controlled.  Stable.  Continue atorvastatin 10 mg once a day.  Review of previous lipid panel is acceptable and we will proceed for 6 months on current dosing.  We will recheck lipid panel in 6 months. - atorvastatin (LIPITOR) 10 MG tablet; Take 1 tablet (10 mg total) by mouth daily.  Dispense: 90 tablet; Refill: 1  2. Primary insomnia Chronic.  Controlled.  Improving.  Patient is doing better on hydroxyzine 10 mg nightly.  Patient is not having issues with daytime somnolence like she did with trazodone.  We will continue at current dosing of hydroxyzine. - hydrOXYzine (ATARAX) 10 MG tablet; Take 1 tablet (10 mg total) by mouth at bedtime as needed.  Dispense: 90 tablet; Refill: 1     Elizabeth Sauer, MD

## 2023-07-29 ENCOUNTER — Ambulatory Visit (INDEPENDENT_AMBULATORY_CARE_PROVIDER_SITE_OTHER): Payer: 59

## 2023-07-29 ENCOUNTER — Encounter: Payer: Self-pay | Admitting: Family Medicine

## 2023-07-29 DIAGNOSIS — Z23 Encounter for immunization: Secondary | ICD-10-CM | POA: Diagnosis not present

## 2023-08-11 ENCOUNTER — Ambulatory Visit
Admission: RE | Admit: 2023-08-11 | Discharge: 2023-08-11 | Disposition: A | Discharge status: Home / Self Care | Payer: 59 | Attending: Family Medicine | Admitting: Family Medicine

## 2023-08-11 ENCOUNTER — Ambulatory Visit
Admission: RE | Admit: 2023-08-11 | Discharge: 2023-08-11 | Disposition: A | Discharge status: Home / Self Care | Payer: 59 | Source: Ambulatory Visit | Attending: Family Medicine | Admitting: Family Medicine

## 2023-08-11 ENCOUNTER — Ambulatory Visit: Payer: 59 | Admitting: Family Medicine

## 2023-08-11 ENCOUNTER — Encounter: Payer: Self-pay | Admitting: Family Medicine

## 2023-08-11 VITALS — BP 110/70 | HR 73 | Temp 97.8°F | Ht 70.0 in | Wt 267.6 lb

## 2023-08-11 DIAGNOSIS — J0110 Acute frontal sinusitis, unspecified: Secondary | ICD-10-CM | POA: Diagnosis not present

## 2023-08-11 DIAGNOSIS — R051 Acute cough: Secondary | ICD-10-CM | POA: Diagnosis not present

## 2023-08-11 MED ORDER — AMOXICILLIN-POT CLAVULANATE 875-125 MG PO TABS
1.0000 | ORAL_TABLET | Freq: Two times a day (BID) | ORAL | 0 refills | Status: DC
Start: 2023-08-11 — End: 2023-09-19

## 2023-08-11 NOTE — Progress Notes (Addendum)
 Date:  08/11/2023   Name:  Richard Frost   DOB:  27-May-1971   MRN:  [id number]   Chief Complaint: Cough (Pt states on Monday 12/30 pt was having green mucus coming from his lungs. He also has a cough that is worst at night. Symptoms first started 12/24.)  sinusi  Sinusitis This is a new problem. The current episode started 1 to 4 weeks ago (at leasr t 2 weeks). The problem has been gradually improving since onset. There has been no fever. He is experiencing no pain. Associated symptoms include congestion, coughing, sinus pressure and a sore throat. Pertinent negatives include no chills, diaphoresis, ear pain, headaches, hoarse voice, neck pain, shortness of breath, sneezing or swollen glands. Treatments tried: mucinex dm. The treatment provided mild relief.  Cough This is a chronic problem. The current episode started 1 to 4 weeks ago (at least 2 weeks). Associated symptoms include nasal congestion, postnasal drip, rhinorrhea and a sore throat. Pertinent negatives include no chest pain, chills, ear congestion, ear pain, fever, headaches, hemoptysis, myalgias, shortness of breath or wheezing. The symptoms are aggravated by lying down. He has tried OTC cough suppressant for the symptoms. The treatment provided mild relief.    Lab Results  Component Value Date   NA 142 09/29/2022   K 4.2 09/29/2022   CO2 23 09/29/2022   GLUCOSE 119 (H) 09/29/2022   BUN 15 09/29/2022   CREATININE 1.33 (H) 09/29/2022   CALCIUM  9.3 09/29/2022   EGFR 64 09/29/2022   GFRNONAA 68 05/07/2020   Lab Results  Component Value Date   CHOL 166 09/29/2022   HDL 63 09/29/2022   LDLCALC 86 09/29/2022   TRIG 91 09/29/2022   CHOLHDL 4.0 11/20/2018   Lab Results  Component Value Date   TSH 2.150 08/08/2015   Lab Results  Component Value Date   HGBA1C 5.7 (H) 01/31/2023   Lab Results  Component Value Date   WBC 6.7 04/04/2017   HGB 14.1 04/04/2017   HCT 42.9 04/04/2017   MCV 90.9 04/04/2017   PLT  178 04/04/2017   Lab Results  Component Value Date   ALT 38 11/05/2021   AST 31 11/05/2021   ALKPHOS 90 11/05/2021   BILITOT 0.6 11/05/2021   Lab Results  Component Value Date   VD25OH 27.0 (L) 11/06/2015     Review of Systems  Constitutional:  Negative for chills, diaphoresis and fever.  HENT:  Positive for congestion, postnasal drip, rhinorrhea, sinus pressure and sore throat. Negative for ear pain, hoarse voice and sneezing.   Respiratory:  Positive for cough. Negative for hemoptysis, shortness of breath and wheezing.   Cardiovascular:  Negative for chest pain.  Musculoskeletal:  Negative for myalgias and neck pain.  Neurological:  Negative for headaches.    Patient Active Problem List   Diagnosis Date Noted   Spondylosis of lumbosacral region without myelopathy or radiculopathy 11/24/2022   Colon cancer screening 05/14/2020   Biliary colic    Gallstones 08/19/2015   Fatty liver disease, nonalcoholic 08/19/2015   Vitamin D  deficiency 08/11/2015   OCD (obsessive compulsive disorder) 08/08/2015   Allergic rhinitis 08/08/2015   Nephrolithiasis 08/08/2015   Prediabetes 08/08/2015   OSA treated with BiPAP 08/08/2015   Obesity, Class II, BMI 35-39.9, with comorbidity 08/08/2015    Allergies  Allergen Reactions   Sulfa Antibiotics Hives   Septra [Sulfamethoxazole-Trimethoprim] Hives    Past Surgical History:  Procedure Laterality Date   CHOLECYSTECTOMY N/A 04/05/2017   Procedure:  LAPAROSCOPIC CHOLECYSTECTOMY WITH INTRAOPERATIVE CHOLANGIOGRAM;  Surgeon: Wonda Charlie BRAVO, MD;  Location: [facility];  Service: General;  Laterality: N/A;   COLONOSCOPY WITH PROPOFOL  N/A 06/04/2020   Procedure: COLONOSCOPY WITH PROPOFOL ;  Surgeon: Unk Corinn Skiff, MD;  Location: [facility];  Service: Gastroenterology;  Laterality: N/A;   INTERNAL URETHROTOMY     remove kidney stone   LITHOTRIPSY     LITHOTRIPSY     x 7    Social History   Tobacco Use   Smoking status: Never    Smokeless tobacco: Never  Vaping Use   Vaping status: Never Used  Substance Use Topics   Alcohol use: Yes    Alcohol/week: 0.0 standard drinks of alcohol    Comment: 1 drink per month    Drug use: No     Medication list has been reviewed and updated.  Current Meds  Medication Sig   ARIPiprazole (ABILIFY) 20 MG tablet Take 20 mg by mouth at bedtime as needed.   atorvastatin  (LIPITOR) 10 MG tablet Take 1 tablet (10 mg total) by mouth daily.   cetirizine (ZYRTEC) 10 MG tablet Take 10 mg by mouth at bedtime. otc   clonazePAM (KLONOPIN) 2 MG tablet Take 1 tablet by mouth at bedtime. cbc   cyclobenzaprine  (FLEXERIL ) 10 MG tablet TAKE 1 TABLET BY MOUTH 3 TIMES DAILY AS NEEDED FOR MUSCLE SPASMS   docusate sodium (COLACE) 100 MG capsule Take 100 mg by mouth 2 (two) times daily.   fluvoxaMINE (LUVOX) 100 MG tablet Take 100 mg by mouth 2 (two) times daily. Cbc 100mg  in am and 150mg  qhs   hydrOXYzine  (ATARAX ) 10 MG tablet Take 1 tablet (10 mg total) by mouth at bedtime as needed.   ibuprofen  (ADVIL ) 600 MG tablet Take 1 tablet (600 mg total) by mouth every 8 (eight) hours as needed.   Omega 3 1000 MG CAPS Take 1 capsule (1,000 mg total) by mouth 2 (two) times daily.       08/11/2023    2:45 PM 06/14/2023    8:16 AM 04/28/2023    8:43 AM 01/31/2023    8:33 AM  GAD 7 : Generalized Anxiety Score  Nervous, Anxious, on Edge 2 3 1  0  Control/stop worrying 2 1 0 0  Worry too much - different things 2 1 0 0  Trouble relaxing 2 1 0 0  Restless 2 1 0 0  Easily annoyed or irritable 0 0 0 0  Afraid - awful might happen 0 0 0 0  Total GAD 7 Score 10 7 1  0  Anxiety Difficulty Somewhat difficult Not difficult at all Not difficult at all Not difficult at all       08/11/2023    2:45 PM 06/14/2023    8:16 AM 04/28/2023    8:42 AM  Depression screen PHQ 2/9  Decreased Interest 1 3 0  Down, Depressed, Hopeless 0 0 0  PHQ - 2 Score 1 3 0  Altered sleeping 1 1 1   Tired, decreased energy 1 1 0  Change  in appetite 0 1 0  Feeling bad or failure about yourself  0 0 0  Trouble concentrating 1 1 0  Moving slowly or fidgety/restless 2 3 0  Suicidal thoughts 0 0 0  PHQ-9 Score 6 10 1   Difficult doing work/chores Somewhat difficult Not difficult at all Not difficult at all    BP Readings from Last 3 Encounters:  08/11/23 110/70  06/14/23 108/76  04/28/23 122/68    Physical  Exam Vitals and nursing note reviewed.  HENT:     Head: Normocephalic.     Right Ear: Tympanic membrane and external ear normal.     Left Ear: Tympanic membrane and external ear normal.     Nose:     Right Sinus: No maxillary sinus tenderness or frontal sinus tenderness.     Left Sinus: Frontal sinus tenderness present. No maxillary sinus tenderness.     Mouth/Throat:     Mouth: Mucous membranes are moist.     Pharynx: Oropharynx is clear.  Eyes:     General: No scleral icterus.       Right eye: No discharge.        Left eye: No discharge.     Conjunctiva/sclera: Conjunctivae normal.     Pupils: Pupils are equal, round, and reactive to light.  Neck:     Thyroid: No thyromegaly.     Vascular: No JVD.     Trachea: No tracheal deviation.  Cardiovascular:     Rate and Rhythm: Normal rate and regular rhythm.     Heart sounds: Normal heart sounds, S1 normal and S2 normal. No murmur heard.    No systolic murmur is present.     No diastolic murmur is present.     No friction rub. No gallop. No S3 or S4 sounds.  Pulmonary:     Effort: No respiratory distress.     Breath sounds: Normal breath sounds. No decreased breath sounds, wheezing, rhonchi or rales.  Abdominal:     General: Bowel sounds are normal.     Palpations: Abdomen is soft. There is no mass.     Tenderness: There is no abdominal tenderness. There is no guarding or rebound.  Musculoskeletal:        General: No tenderness. Normal range of motion.     Cervical back: Normal range of motion and neck supple.  Lymphadenopathy:     Cervical: No cervical  adenopathy.  Skin:    General: Skin is warm.     Findings: No rash.  Neurological:     Mental Status: He is alert.     Wt Readings from Last 3 Encounters:  08/11/23 267 lb 9.6 oz (121.4 kg)  06/14/23 268 lb (121.6 kg)  04/28/23 269 lb (122 kg)    BP 110/70   Pulse 73   Temp 97.8 F (36.6 C)   Ht 5' 10 (1.778 m)   Wt 267 lb 9.6 oz (121.4 kg)   SpO2 95%   BMI 38.40 kg/m   Assessment and Plan: 1. Acute cough (Primary) New onset about 2 weeks ago of a productive cough not associated with fever or chills which is BNP relatively persistent particularly at night.  There has been no shortness of breath or episodes of wheezing over the course of 2 weeks.  Given the persistent nature of the cough and exposure to other individuals that have been sick with pneumonia we will get a chest x-ray. - DG Chest 2 View  2. Acute non-recurrent frontal sinusitis New onset.  Persistent.  There is tenderness over the left frontal sinus without involvement of maxillary sinuses.  Patient also has noted no purulent discharge with clearing of the nose.  We will given the nature of the persistent of the productivity and the cough treat with Augmentin  875 mg twice a day for 10 days and await chest x-ray to see if pneumonia is in the mix as well. - amoxicillin -clavulanate (AUGMENTIN ) 875-125 MG tablet; Take 1  tablet by mouth 2 (two) times daily.  Dispense: 20 tablet; Refill: 0     Cathryne Molt, MD

## 2023-08-12 ENCOUNTER — Encounter: Payer: Self-pay | Admitting: Family Medicine

## 2023-09-08 ENCOUNTER — Encounter: Payer: Self-pay | Admitting: Family Medicine

## 2023-09-09 ENCOUNTER — Other Ambulatory Visit: Payer: Self-pay

## 2023-09-09 DIAGNOSIS — I700 Atherosclerosis of aorta: Secondary | ICD-10-CM

## 2023-09-19 ENCOUNTER — Ambulatory Visit: Payer: 59 | Admitting: Family Medicine

## 2023-09-19 ENCOUNTER — Encounter: Payer: Self-pay | Admitting: Family Medicine

## 2023-09-19 VITALS — BP 120/82 | HR 92 | Temp 97.5°F | Ht 70.0 in | Wt 266.0 lb

## 2023-09-19 DIAGNOSIS — J0110 Acute frontal sinusitis, unspecified: Secondary | ICD-10-CM | POA: Diagnosis not present

## 2023-09-19 MED ORDER — AMOXICILLIN-POT CLAVULANATE 875-125 MG PO TABS
1.0000 | ORAL_TABLET | Freq: Two times a day (BID) | ORAL | 0 refills | Status: DC
Start: 2023-09-19 — End: 2023-10-03

## 2023-09-19 NOTE — Progress Notes (Signed)
 Date:  09/19/2023   Name:  Richard Frost   DOB:  05-13-1971   MRN:  [id number]   Chief Complaint: Sinusitis (X2 weeks,throat felt irritated, eye watering, runny nose, cough )  Sinusitis This is a recurrent problem. The current episode started 1 to 4 weeks ago (2 weeks). The problem has been gradually worsening since onset. There has been no fever. The pain is moderate. Associated symptoms include congestion, coughing, a hoarse voice, sinus pressure and a sore throat. Pertinent negatives include no chills, diaphoresis, ear pain, headaches, neck pain, shortness of breath, sneezing or swollen glands. (fatigue) Treatments tried: cepachol. The treatment provided mild relief.    Lab Results  Component Value Date   NA 142 09/29/2022   K 4.2 09/29/2022   CO2 23 09/29/2022   GLUCOSE 119 (H) 09/29/2022   BUN 15 09/29/2022   CREATININE 1.33 (H) 09/29/2022   CALCIUM  9.3 09/29/2022   EGFR 64 09/29/2022   GFRNONAA 68 05/07/2020   Lab Results  Component Value Date   CHOL 166 09/29/2022   HDL 63 09/29/2022   LDLCALC 86 09/29/2022   TRIG 91 09/29/2022   CHOLHDL 4.0 11/20/2018   Lab Results  Component Value Date   TSH 2.150 08/08/2015   Lab Results  Component Value Date   HGBA1C 5.7 (H) 01/31/2023   Lab Results  Component Value Date   WBC 6.7 04/04/2017   HGB 14.1 04/04/2017   HCT 42.9 04/04/2017   MCV 90.9 04/04/2017   PLT 178 04/04/2017   Lab Results  Component Value Date   ALT 38 11/05/2021   AST 31 11/05/2021   ALKPHOS 90 11/05/2021   BILITOT 0.6 11/05/2021   Lab Results  Component Value Date   VD25OH 27.0 (L) 11/06/2015     Review of Systems  Constitutional:  Positive for fatigue. Negative for chills, diaphoresis and fever.  HENT:  Positive for congestion, hoarse voice, nosebleeds, postnasal drip, rhinorrhea, sinus pressure and sore throat. Negative for ear pain, facial swelling and sneezing.   Respiratory:  Positive for cough. Negative for chest tightness  and shortness of breath.   Cardiovascular:  Negative for chest pain, palpitations and leg swelling.  Gastrointestinal:  Negative for blood in stool.  Musculoskeletal:  Negative for neck pain.  Neurological:  Positive for light-headedness. Negative for headaches.    Patient Active Problem List   Diagnosis Date Noted   Spondylosis of lumbosacral region without myelopathy or radiculopathy 11/24/2022   Colon cancer screening 05/14/2020   Biliary colic    Gallstones 08/19/2015   Fatty liver disease, nonalcoholic 08/19/2015   Vitamin D  deficiency 08/11/2015   OCD (obsessive compulsive disorder) 08/08/2015   Allergic rhinitis 08/08/2015   Nephrolithiasis 08/08/2015   Prediabetes 08/08/2015   OSA treated with BiPAP 08/08/2015   Obesity, Class II, BMI 35-39.9, with comorbidity 08/08/2015    Allergies  Allergen Reactions   Sulfa Antibiotics Hives   Septra [Sulfamethoxazole-Trimethoprim] Hives    Past Surgical History:  Procedure Laterality Date   CHOLECYSTECTOMY N/A 04/05/2017   Procedure: LAPAROSCOPIC CHOLECYSTECTOMY WITH INTRAOPERATIVE CHOLANGIOGRAM;  Surgeon: Claudia Cuff, MD;  Location: [facility];  Service: General;  Laterality: N/A;   COLONOSCOPY WITH PROPOFOL  N/A 06/04/2020   Procedure: COLONOSCOPY WITH PROPOFOL ;  Surgeon: Selena Daily, MD;  Location: [facility];  Service: Gastroenterology;  Laterality: N/A;   INTERNAL URETHROTOMY     remove kidney stone   LITHOTRIPSY     LITHOTRIPSY     x 7  Social History   Tobacco Use   Smoking status: Never   Smokeless tobacco: Never  Vaping Use   Vaping status: Never Used  Substance Use Topics   Alcohol use: Yes    Alcohol/week: 0.0 standard drinks of alcohol    Comment: 1 drink per month    Drug use: No     Medication list has been reviewed and updated.  Current Meds  Medication Sig   ARIPiprazole (ABILIFY) 20 MG tablet Take 20 mg by mouth at bedtime as needed.   atorvastatin  (LIPITOR) 10 MG tablet Take  1 tablet (10 mg total) by mouth daily.   cetirizine (ZYRTEC) 10 MG tablet Take 10 mg by mouth at bedtime. otc   clonazePAM (KLONOPIN) 2 MG tablet Take 1 tablet by mouth at bedtime. cbc   cyclobenzaprine  (FLEXERIL ) 10 MG tablet TAKE 1 TABLET BY MOUTH 3 TIMES DAILY AS NEEDED FOR MUSCLE SPASMS   docusate sodium (COLACE) 100 MG capsule Take 100 mg by mouth 2 (two) times daily.   fluvoxaMINE (LUVOX) 100 MG tablet Take 100 mg by mouth 2 (two) times daily. Cbc 100mg  in am and 150mg  qhs   hydrOXYzine  (ATARAX ) 10 MG tablet Take 1 tablet (10 mg total) by mouth at bedtime as needed.   ibuprofen  (ADVIL ) 600 MG tablet Take 1 tablet (600 mg total) by mouth every 8 (eight) hours as needed.   Omega 3 1000 MG CAPS Take 1 capsule (1,000 mg total) by mouth 2 (two) times daily.       09/19/2023    1:56 PM 08/11/2023    2:45 PM 06/14/2023    8:16 AM 04/28/2023    8:43 AM  GAD 7 : Generalized Anxiety Score  Nervous, Anxious, on Edge 0 2 3 1   Control/stop worrying 0 2 1 0  Worry too much - different things 0 2 1 0  Trouble relaxing 0 2 1 0  Restless 0 2 1 0  Easily annoyed or irritable 0 0 0 0  Afraid - awful might happen 0 0 0 0  Total GAD 7 Score 0 10 7 1   Anxiety Difficulty Not difficult at all Somewhat difficult Not difficult at all Not difficult at all       09/19/2023    1:56 PM 08/11/2023    2:45 PM 06/14/2023    8:16 AM  Depression screen PHQ 2/9  Decreased Interest 0 1 3  Down, Depressed, Hopeless 0 0 0  PHQ - 2 Score 0 1 3  Altered sleeping  1 1  Tired, decreased energy  1 1  Change in appetite  0 1  Feeling bad or failure about yourself   0 0  Trouble concentrating  1 1  Moving slowly or fidgety/restless  2 3  Suicidal thoughts  0 0  PHQ-9 Score  6 10  Difficult doing work/chores  Somewhat difficult Not difficult at all    BP Readings from Last 3 Encounters:  09/19/23 120/82  08/11/23 110/70  06/14/23 108/76    Physical Exam Vitals and nursing note reviewed.  Constitutional:       Appearance: He is well-developed.  HENT:     Head: Normocephalic and atraumatic.     Jaw: There is normal jaw occlusion.     Salivary Glands: Right salivary gland is not diffusely enlarged.     Right Ear: Tympanic membrane, ear canal and external ear normal.     Left Ear: Tympanic membrane, ear canal and external ear normal.  Nose: Nose normal.     Right Turbinates: Swollen.     Left Turbinates: Swollen.     Right Sinus: No maxillary sinus tenderness or frontal sinus tenderness.     Left Sinus: No maxillary sinus tenderness or frontal sinus tenderness.     Mouth/Throat:     Lips: Pink.     Mouth: Mucous membranes are moist.     Dentition: Normal dentition.     Tongue: No lesions.     Palate: No mass.     Pharynx: Oropharynx is clear. No pharyngeal swelling, oropharyngeal exudate or posterior oropharyngeal erythema.  Eyes:     General: Lids are normal. No scleral icterus.    Conjunctiva/sclera: Conjunctivae normal.     Pupils: Pupils are equal, round, and reactive to light.  Neck:     Thyroid: No thyromegaly.     Vascular: No carotid bruit, hepatojugular reflux or JVD.     Trachea: No tracheal deviation.  Cardiovascular:     Rate and Rhythm: Normal rate and regular rhythm.     Heart sounds: No murmur heard.    No friction rub. No gallop.  Pulmonary:     Effort: Pulmonary effort is normal.     Breath sounds: Normal breath sounds. No stridor. No wheezing or rales.  Abdominal:     General: Bowel sounds are normal.     Palpations: Abdomen is soft. There is no hepatomegaly, splenomegaly or mass.     Tenderness: There is no abdominal tenderness. There is no guarding or rebound.     Hernia: There is no hernia in the left inguinal area.  Musculoskeletal:        General: Normal range of motion.     Cervical back: Normal range of motion and neck supple.  Lymphadenopathy:     Cervical: No cervical adenopathy.  Skin:    General: Skin is warm and dry.     Findings: No rash.   Neurological:     Mental Status: He is alert and oriented to person, place, and time.     Sensory: No sensory deficit.     Deep Tendon Reflexes: Reflexes are normal and symmetric.  Psychiatric:        Mood and Affect: Mood is not anxious or depressed.     Wt Readings from Last 3 Encounters:  09/19/23 266 lb (120.7 kg)  08/11/23 267 lb 9.6 oz (121.4 kg)  06/14/23 268 lb (121.6 kg)    BP 120/82   Pulse 92   Temp (!) 97.5 F (36.4 C)   Ht 5\' 10"  (1.778 m)   Wt 266 lb (120.7 kg)   SpO2 95%   BMI 38.17 kg/m   Assessment and Plan:  1. Acute non-recurrent frontal sinusitis (Primary) New onset.  Persistent.  Onset about 2 weeks ago and has not resolved with conservative measures.  Tenderness is noted over both maxillary sinuses and frontal sinuses as well.  Symptomatology of postnasal drainage with perhaps some blood and tenderness noted over the sinuses consistent with an acute maxillary sinusitis and we will treat with Augmentin  875 mg twice a day for 10 days. - amoxicillin -clavulanate (AUGMENTIN ) 875-125 MG tablet; Take 1 tablet by mouth 2 (two) times daily.  Dispense: 20 tablet; Refill: 0    Alayne Allis, MD

## 2023-09-30 NOTE — Progress Notes (Signed)
 Cardiology Office Note    Date:  10/03/2023   ID:  Siddhartha, Hoback 24-Oct-1970, MRN [id number]  PCP:  Duanne Limerick, MD  Cardiologist:  New Electrophysiologist:  None   Chief Complaint: Evaluation of aortic atherosclerosis  History of Present Illness:   Hermann Dottavio is a 53 y.o. male with history of aortic atherosclerosis, HLD, fatty liver disease, nephrolithiasis, prediabetes, obesity, and OSA on BiPAP who presents for evaluation of aortic atherosclerosis noted on chest x-ray.  He was evaluated by PCP in 08/2023 for cough and sinusitis.  He was treated with Augmentin.  Subsequent chest x-ray on 08/11/2023 showed no acute cardiopulmonary process with aortic atherosclerosis noted.  In this setting, he was referred to cardiology at his request for further risk stratification.  He comes in doing well from a cardiac perspective and is without symptoms of angina or cardiac decompensation.  No chest pain, dyspnea, palpitations, dizziness, presyncope, or syncope.  No significant lower extremity swelling, progressive orthopnea, or early satiety.  Never a smoker.  Does not drink alcohol or use illicit substances.  Has been on atorvastatin 10 mg for approximately 1 year.   Labs independently reviewed: 01/2023 - A1c 5.7 09/2022 - TC 166, TG 91, HDL 63, LDL 86, BUN 15, serum creatinine 1.33, potassium 4.2, albumin 4.6 10/2021 - AST/ALT normal 03/2017 - Hgb 14.1, PLT 178 07/2015 - TSH normal  Past Medical History:  Diagnosis Date   Allergic rhinitis 08/08/2015   Allergy    Anxiety    Biliary colic    Depression    Fatty liver disease, nonalcoholic 08/19/2015   Gallstones 08/19/2015   History of kidney stones    Nephrolithiasis 08/08/2015   Obesity, Class II, BMI 35-39.9, with comorbidity 08/08/2015   OCD (obsessive compulsive disorder)    OSA treated with BiPAP 08/08/2015   Prediabetes 08/08/2015   Sleep apnea    cpap   Vitamin D deficiency 08/11/2015    Past Surgical  History:  Procedure Laterality Date   CHOLECYSTECTOMY N/A 04/05/2017   Procedure: LAPAROSCOPIC CHOLECYSTECTOMY WITH INTRAOPERATIVE CHOLANGIOGRAM;  Surgeon: Lattie Haw, MD;  Location: [facility];  Service: General;  Laterality: N/A;   COLONOSCOPY WITH PROPOFOL N/A 06/04/2020   Procedure: COLONOSCOPY WITH PROPOFOL;  Surgeon: Toney Reil, MD;  Location: [facility];  Service: Gastroenterology;  Laterality: N/A;   INTERNAL URETHROTOMY     remove kidney stone   LITHOTRIPSY     LITHOTRIPSY     x 7    Current Medications: Current Meds  Medication Sig   ARIPiprazole (ABILIFY) 20 MG tablet Take 20 mg by mouth at bedtime as needed.   atorvastatin (LIPITOR) 10 MG tablet Take 1 tablet (10 mg total) by mouth daily.   cetirizine (ZYRTEC) 10 MG tablet Take 10 mg by mouth at bedtime. otc   clonazePAM (KLONOPIN) 2 MG tablet Take 1 tablet by mouth at bedtime. cbc   docusate sodium (COLACE) 100 MG capsule Take 100 mg by mouth 2 (two) times daily.   fluvoxaMINE (LUVOX) 100 MG tablet Take 100 mg by mouth 2 (two) times daily. Cbc 100mg  in am and 150mg  qhs   hydrOXYzine (ATARAX) 10 MG tablet Take 1 tablet (10 mg total) by mouth at bedtime as needed.   ibuprofen (ADVIL) 600 MG tablet Take 1 tablet (600 mg total) by mouth every 8 (eight) hours as needed.   Omega 3 1000 MG CAPS Take 1 capsule (1,000 mg total) by mouth 2 (two) times daily.  Allergies:   Sulfa antibiotics and Septra [sulfamethoxazole-trimethoprim]   Social History   Socioeconomic History   Marital status: Married    Spouse name: Not on file   Number of children: Not on file   Years of education: Not on file   Highest education level: Bachelor's degree (e.g., BA, AB, BS)  Occupational History   Not on file  Tobacco Use   Smoking status: Never   Smokeless tobacco: Never  Vaping Use   Vaping status: Never Used  Substance and Sexual Activity   Alcohol use: Yes    Alcohol/week: 0.0 standard drinks of alcohol     Comment: 1 drink per month    Drug use: No   Sexual activity: Not Currently  Other Topics Concern   Not on file  Social History Narrative   ** Merged History Encounter **       Social Drivers of Health   Financial Resource Strain: Low Risk  (08/09/2023)   Overall Financial Resource Strain (CARDIA)    Difficulty of Paying Living Expenses: Not hard at all  Food Insecurity: No Food Insecurity (09/19/2023)   Hunger Vital Sign    Worried About Running Out of Food in the Last Year: Never true    Ran Out of Food in the Last Year: Never true  Transportation Needs: No Transportation Needs (09/19/2023)   PRAPARE - Administrator, Civil Service (Medical): No    Lack of Transportation (Non-Medical): No  Physical Activity: Insufficiently Active (08/09/2023)   Exercise Vital Sign    Days of Exercise per Week: 1 day    Minutes of Exercise per Session: 10 min  Stress: Stress Concern Present (08/09/2023)   [employer] of Occupational Health - Occupational Stress Questionnaire    Feeling of Stress : To some extent  Social Connections: Socially Integrated (08/09/2023)   Social Connection and Isolation Panel [NHANES]    Frequency of Communication with Friends and Family: Twice a week    Frequency of Social Gatherings with Friends and Family: Once a week    Attends Religious Services: More than 4 times per year    Active Member of [employer] or Organizations: Yes    Attends Engineer, structural: More than 4 times per year    Marital Status: Married     Family History:  The patient's family history includes Cancer in his maternal aunt; Diabetes in his paternal aunt and paternal grandmother; Heart attack in his maternal grandfather; Heart failure in his paternal grandfather; Hyperlipidemia in his father; Hypertension in his father and sister; Osteoarthritis in his father and mother.  ROS:   12-point review of systems is negative unless otherwise noted in the  HPI.   EKGs/Labs/Other Studies Reviewed:    Studies reviewed were summarized above. The additional studies were reviewed today: No studies available for review.  EKG:  EKG is ordered today.  The EKG ordered today demonstrates NSR, 97 bpm, no acute st/t changes  Recent Labs: No results found for requested labs within last 365 days.  Recent Lipid Panel    Component Value Date/Time   CHOL 166 09/29/2022 0840   TRIG 91 09/29/2022 0840   HDL 63 09/29/2022 0840   CHOLHDL 4.0 11/20/2018 1011   LDLCALC 86 09/29/2022 0840    PHYSICAL EXAM:    VS:  BP 124/80 (BP Location: Right Arm, Cuff Size: Large)   Pulse 97   Ht 5' 10.5" (1.791 m)   Wt 267 lb (121.1 kg)   SpO2  98%   BMI 37.77 kg/m   BMI: Body mass index is 37.77 kg/m.  Physical Exam Vitals reviewed.  Constitutional:      Appearance: He is well-developed.  HENT:     Head: Normocephalic and atraumatic.  Eyes:     General:        Right eye: No discharge.        Left eye: No discharge.  Cardiovascular:     Rate and Rhythm: Normal rate and regular rhythm.     Heart sounds: Normal heart sounds, S1 normal and S2 normal. Heart sounds not distant. No midsystolic click and no opening snap. No murmur heard.    No friction rub.  Pulmonary:     Effort: Pulmonary effort is normal. No respiratory distress.     Breath sounds: Normal breath sounds. No decreased breath sounds, wheezing, rhonchi or rales.  Chest:     Chest wall: No tenderness.  Musculoskeletal:     Cervical back: Normal range of motion.  Skin:    General: Skin is warm and dry.     Nails: There is no clubbing.  Neurological:     Mental Status: He is alert and oriented to person, place, and time.  Psychiatric:        Speech: Speech normal.        Behavior: Behavior normal.        Thought Content: Thought content normal.        Judgment: Judgment normal.     Wt Readings from Last 3 Encounters:  10/03/23 267 lb (121.1 kg)  09/19/23 266 lb (120.7 kg)  08/11/23  267 lb 9.6 oz (121.4 kg)     ASSESSMENT & PLAN:   Aortic atherosclerosis/HLD: Without symptoms of angina or cardiac decompensation.  Aortic atherosclerosis noted on chest x-ray.  LDL 86 in 09/2022.  Target LDL less than 70 with aortic atherosclerosis noted on chest x-ray.  Currently on atorvastatin 10 mg.  Check lipid panel, LFT, and direct LDL with recommendation to escalate lipid-lowering therapy as indicated.  Obtain calcium score for further cardiac risk stratification.  Obesity with OSA: Weight loss encouraged through heart healthy diet and regular exercise.  Remains adherent to BiPAP.    Disposition: F/u with me in 2 months   Medication Adjustments/Labs and Tests Ordered: Current medicines are reviewed at length with the patient today.  Concerns regarding medicines are outlined above. Medication changes, Labs and Tests ordered today are summarized above and listed in the Patient Instructions accessible in Encounters.   Signed, Eula Listen, PA-C 10/03/2023 1:42 PM     [facility] [address] [place], [place] [address] [phone]

## 2023-10-03 ENCOUNTER — Encounter: Payer: Self-pay | Admitting: Physician Assistant

## 2023-10-03 ENCOUNTER — Ambulatory Visit: Payer: 59 | Attending: Physician Assistant | Admitting: Physician Assistant

## 2023-10-03 VITALS — BP 124/80 | HR 97 | Ht 70.5 in | Wt 267.0 lb

## 2023-10-03 DIAGNOSIS — G4733 Obstructive sleep apnea (adult) (pediatric): Secondary | ICD-10-CM

## 2023-10-03 DIAGNOSIS — Z6837 Body mass index (BMI) 37.0-37.9, adult: Secondary | ICD-10-CM

## 2023-10-03 DIAGNOSIS — E785 Hyperlipidemia, unspecified: Secondary | ICD-10-CM

## 2023-10-03 DIAGNOSIS — E66812 Obesity, class 2: Secondary | ICD-10-CM | POA: Diagnosis not present

## 2023-10-03 DIAGNOSIS — I700 Atherosclerosis of aorta: Secondary | ICD-10-CM | POA: Diagnosis not present

## 2023-10-03 NOTE — Patient Instructions (Signed)
 Medication Instructions:  Your Physician recommend you continue on your current medication as directed.    *If you need a refill on your cardiac medications before your next appointment, please call your pharmacy*   Lab Work: Your provider would like for you to have following labs drawn today Lipid panel, liver function, and direct LDL.   If you have labs (blood work) drawn today and your tests are completely normal, you will receive your results only by: MyChart Message (if you have MyChart) OR A paper copy in the mail If you have any lab test that is abnormal or we need to change your treatment, we will call you to review the results.   Testing/Procedures: CT coronary calcium score.   - $99 out of pocket cost at the time of your test - Call [phone] to schedule at your convenience.  [facility] [address] [place], [place] [address]   Coronary CalciumScan A coronary calcium scan is an imaging test used to look for deposits of calcium and other fatty materials (plaques) in the inner lining of the blood vessels of the heart (coronary arteries). These deposits of calcium and plaques can partly clog and narrow the coronary arteries without producing any symptoms or warning signs. This puts a person at risk for a heart attack. This test can detect these deposits before symptoms develop. Tell a health care provider about: Any allergies you have. All medicines you are taking, including vitamins, herbs, eye drops, creams, and over-the-counter medicines. Any problems you or family members have had with anesthetic medicines. Any blood disorders you have. Any surgeries you have had. Any medical conditions you have. Whether you are pregnant or may be pregnant. What are the risks? Generally, this is a safe procedure. However, problems may occur, including: Harm to a pregnant woman and her unborn baby. This test involves the use of radiation.  Radiation exposure can be dangerous to a pregnant woman and her unborn baby. If you are pregnant, you generally should not have this procedure done. Slight increase in the risk of cancer. This is because of the radiation involved in the test. What happens before the procedure? No preparation is needed for this procedure. What happens during the procedure? You will undress and remove any jewelry around your neck or chest. You will put on a hospital gown. Sticky electrodes will be placed on your chest. The electrodes will be connected to an electrocardiogram (ECG) machine to record a tracing of the electrical activity of your heart. A CT scanner will take pictures of your heart. During this time, you will be asked to lie still and hold your breath for 2-3 seconds while a picture of your heart is being taken. The procedure may vary among health care providers and hospitals. What happens after the procedure? You can get dressed. You can return to your normal activities. It is up to you to get the results of your test. Ask your health care provider, or the department that is doing the test, when your results will be ready. Summary A coronary calcium scan is an imaging test used to look for deposits of calcium and other fatty materials (plaques) in the inner lining of the blood vessels of the heart (coronary arteries). Generally, this is a safe procedure. Tell your health care provider if you are pregnant or may be pregnant. No preparation is needed for this procedure. A CT scanner will take pictures of your heart. You can return to  your normal activities after the scan is done. This information is not intended to replace advice given to you by your health care provider. Make sure you discuss any questions you have with your health care provider.   Follow-Up: At [facility], you and your health needs are our priority.  As part of our continuing mission to provide you with exceptional  heart care, we have created designated Provider Care Teams.  These Care Teams include your primary Cardiologist (physician) and Advanced Practice Providers (APPs -  Physician Assistants and Nurse Practitioners) who all work together to provide you with the care you need, when you need it.   Your next appointment:   3 month(s)  Provider:   You may see Eula Listen, PA-C

## 2023-10-04 LAB — LIPID PANEL
Chol/HDL Ratio: 2.9 {ratio} (ref 0.0–5.0)
Cholesterol, Total: 172 mg/dL (ref 100–199)
HDL: 60 mg/dL (ref >39)
LDL Chol Calc (NIH): 90 mg/dL (ref 0–99)
Triglycerides: 126 mg/dL (ref 0–149)
VLDL Cholesterol Cal: 22 mg/dL (ref 5–40)

## 2023-10-04 LAB — HEPATIC FUNCTION PANEL
ALT: 39 [IU]/L (ref 0–44)
AST: 31 [IU]/L (ref 0–40)
Albumin: 4.7 g/dL (ref 3.8–4.9)
Alkaline Phosphatase: 106 [IU]/L (ref 44–121)
Bilirubin Total: 0.8 mg/dL (ref 0.0–1.2)
Bilirubin, Direct: 0.23 mg/dL (ref 0.00–0.40)
Total Protein: 7.2 g/dL (ref 6.0–8.5)

## 2023-10-04 LAB — LDL CHOLESTEROL, DIRECT: LDL Direct: 87 mg/dL (ref 0–99)

## 2023-10-07 ENCOUNTER — Ambulatory Visit
Admission: RE | Admit: 2023-10-07 | Discharge: 2023-10-07 | Disposition: A | Discharge status: Home / Self Care | Payer: Self-pay | Source: Ambulatory Visit | Attending: Physician Assistant | Admitting: Physician Assistant

## 2023-10-07 DIAGNOSIS — I700 Atherosclerosis of aorta: Secondary | ICD-10-CM | POA: Insufficient documentation

## 2023-10-13 ENCOUNTER — Encounter: Payer: Self-pay | Admitting: Family Medicine

## 2023-10-13 ENCOUNTER — Ambulatory Visit: Payer: 59 | Admitting: Family Medicine

## 2023-10-13 VITALS — BP 118/76 | HR 102 | Ht 70.5 in | Wt 267.0 lb

## 2023-10-13 DIAGNOSIS — F5101 Primary insomnia: Secondary | ICD-10-CM

## 2023-10-13 DIAGNOSIS — E782 Mixed hyperlipidemia: Secondary | ICD-10-CM

## 2023-10-13 MED ORDER — ATORVASTATIN CALCIUM 10 MG PO TABS: 10.0000 mg | ORAL_TABLET | Freq: Every day | ORAL | 1 refills | Status: DC

## 2023-10-13 MED ORDER — HYDROXYZINE HCL 10 MG PO TABS: 10.0000 mg | ORAL_TABLET | Freq: Every evening | ORAL | 1 refills | Status: DC | PRN

## 2023-10-13 NOTE — Progress Notes (Signed)
 Date:  10/13/2023   Name:  Richard Frost   DOB:  25-Nov-1970   MRN:  [id number]   Chief Complaint: Insomnia and Hyperlipidemia  Insomnia Primary symptoms: no fragmented sleep, no sleep disturbance, no difficulty falling asleep, no somnolence, no frequent awakening, no premature morning awakening, no malaise/fatigue, no napping.   The problem has been gradually improving since onset. The symptoms are aggravated by anxiety. Past treatments include medication (hydoxyzine). The treatment provided moderate relief.  Hyperlipidemia This is a chronic problem. The current episode started more than 1 year ago. The problem is controlled. Recent lipid tests were reviewed and are normal. He has no history of chronic renal disease, diabetes, hypothyroidism, liver disease, obesity or nephrotic syndrome. There are no known factors aggravating his hyperlipidemia. Pertinent negatives include no chest pain, focal sensory loss, focal weakness, leg pain, myalgias or shortness of breath. Current antihyperlipidemic treatment includes statins. The current treatment provides moderate improvement of lipids. There are no compliance problems.  Risk factors for coronary artery disease include dyslipidemia.    Lab Results  Component Value Date   NA 142 09/29/2022   K 4.2 09/29/2022   CO2 23 09/29/2022   GLUCOSE 119 (H) 09/29/2022   BUN 15 09/29/2022   CREATININE 1.33 (H) 09/29/2022   CALCIUM 9.3 09/29/2022   EGFR 64 09/29/2022   GFRNONAA 68 05/07/2020   Lab Results  Component Value Date   CHOL 172 10/03/2023   HDL 60 10/03/2023   LDLCALC 90 10/03/2023   LDLDIRECT 87 10/03/2023   TRIG 126 10/03/2023   CHOLHDL 2.9 10/03/2023   Lab Results  Component Value Date   TSH 2.150 08/08/2015   Lab Results  Component Value Date   HGBA1C 5.7 (H) 01/31/2023   Lab Results  Component Value Date   WBC 6.7 04/04/2017   HGB 14.1 04/04/2017   HCT 42.9 04/04/2017   MCV 90.9 04/04/2017   PLT 178 04/04/2017    Lab Results  Component Value Date   ALT 39 10/03/2023   AST 31 10/03/2023   ALKPHOS 106 10/03/2023   BILITOT 0.8 10/03/2023   Lab Results  Component Value Date   VD25OH 27.0 (L) 11/06/2015     Review of Systems  Constitutional:  Negative for diaphoresis, fatigue, fever and malaise/fatigue.  HENT:  Negative for congestion.   Respiratory:  Negative for choking, chest tightness, shortness of breath, wheezing and stridor.   Cardiovascular:  Negative for chest pain, palpitations and leg swelling.  Musculoskeletal:  Negative for myalgias.  Neurological:  Negative for focal weakness.  Psychiatric/Behavioral:  Negative for sleep disturbance. The patient has insomnia.     Patient Active Problem List   Diagnosis Date Noted   Spondylosis of lumbosacral region without myelopathy or radiculopathy 11/24/2022   Colon cancer screening 05/14/2020   Biliary colic    Gallstones 08/19/2015   Fatty liver disease, nonalcoholic 08/19/2015   Vitamin D deficiency 08/11/2015   OCD (obsessive compulsive disorder) 08/08/2015   Allergic rhinitis 08/08/2015   Nephrolithiasis 08/08/2015   Prediabetes 08/08/2015   OSA treated with BiPAP 08/08/2015   Obesity, Class II, BMI 35-39.9, with comorbidity 08/08/2015    Allergies  Allergen Reactions   Sulfa Antibiotics Hives   Septra [Sulfamethoxazole-Trimethoprim] Hives    Past Surgical History:  Procedure Laterality Date   CHOLECYSTECTOMY N/A 04/05/2017   Procedure: LAPAROSCOPIC CHOLECYSTECTOMY WITH INTRAOPERATIVE CHOLANGIOGRAM;  Surgeon: Lattie Haw, MD;  Location: [facility] ORS;  Service: General;  Laterality: N/A;   COLONOSCOPY WITH PROPOFOL  N/A 06/04/2020   Procedure: COLONOSCOPY WITH PROPOFOL;  Surgeon: Toney Reil, MD;  Location: [facility] ENDOSCOPY;  Service: Gastroenterology;  Laterality: N/A;   INTERNAL URETHROTOMY     remove kidney stone   LITHOTRIPSY     LITHOTRIPSY     x 7    Social History   Tobacco Use   Smoking status:  Never   Smokeless tobacco: Never  Vaping Use   Vaping status: Never Used  Substance Use Topics   Alcohol use: Yes    Alcohol/week: 0.0 standard drinks of alcohol    Comment: 1 drink per month    Drug use: No     Medication list has been reviewed and updated.  Current Meds  Medication Sig   ARIPiprazole (ABILIFY) 20 MG tablet Take 20 mg by mouth at bedtime as needed.   atorvastatin (LIPITOR) 10 MG tablet Take 1 tablet (10 mg total) by mouth daily.   cetirizine (ZYRTEC) 10 MG tablet Take 10 mg by mouth at bedtime. otc   clonazePAM (KLONOPIN) 2 MG tablet Take 1 tablet by mouth at bedtime. cbc   docusate sodium (COLACE) 100 MG capsule Take 100 mg by mouth 2 (two) times daily.   fluvoxaMINE (LUVOX) 100 MG tablet Take 100 mg by mouth 2 (two) times daily. Cbc 100mg  in am and 150mg  qhs   hydrOXYzine (ATARAX) 10 MG tablet Take 1 tablet (10 mg total) by mouth at bedtime as needed.   ibuprofen (ADVIL) 600 MG tablet Take 1 tablet (600 mg total) by mouth every 8 (eight) hours as needed.   Omega 3 1000 MG CAPS Take 1 capsule (1,000 mg total) by mouth 2 (two) times daily.       10/13/2023   10:47 AM 09/19/2023    1:56 PM 08/11/2023    2:45 PM 06/14/2023    8:16 AM  GAD 7 : Generalized Anxiety Score  Nervous, Anxious, on Edge 2 0 2 3  Control/stop worrying 2 0 2 1  Worry too much - different things 2 0 2 1  Trouble relaxing 2 0 2 1  Restless 2 0 2 1  Easily annoyed or irritable 0 0 0 0  Afraid - awful might happen 2 0 0 0  Total GAD 7 Score 12 0 10 7  Anxiety Difficulty Very difficult Not difficult at all Somewhat difficult Not difficult at all       10/13/2023   10:46 AM 09/19/2023    1:56 PM 08/11/2023    2:45 PM  Depression screen PHQ 2/9  Decreased Interest 0 0 1  Down, Depressed, Hopeless 0 0 0  PHQ - 2 Score 0 0 1  Altered sleeping 0  1  Tired, decreased energy 0  1  Change in appetite 0  0  Feeling bad or failure about yourself  0  0  Trouble concentrating 0  1  Moving slowly  or fidgety/restless 0  2  Suicidal thoughts 0  0  PHQ-9 Score 0  6  Difficult doing work/chores Not difficult at all  Somewhat difficult    BP Readings from Last 3 Encounters:  10/13/23 118/76  10/03/23 124/80  09/19/23 120/82    Physical Exam Vitals and nursing note reviewed.  Constitutional:      Appearance: He is well-developed.  HENT:     Head: Normocephalic and atraumatic.     Right Ear: Tympanic membrane, ear canal and external ear normal.     Left Ear: Tympanic membrane, ear canal and external  ear normal.     Nose: Nose normal.     Mouth/Throat:     Mouth: Mucous membranes are moist.     Dentition: Normal dentition.  Eyes:     General: Lids are normal. No scleral icterus.    Conjunctiva/sclera: Conjunctivae normal.     Pupils: Pupils are equal, round, and reactive to light.  Neck:     Thyroid: No thyromegaly.     Vascular: No carotid bruit, hepatojugular reflux or JVD.     Trachea: No tracheal deviation.  Cardiovascular:     Rate and Rhythm: Normal rate and regular rhythm.     Heart sounds: Normal heart sounds.  Pulmonary:     Effort: Pulmonary effort is normal.     Breath sounds: Normal breath sounds. No wheezing, rhonchi or rales.  Abdominal:     General: Bowel sounds are normal.     Palpations: Abdomen is soft. There is no hepatomegaly, splenomegaly or mass.     Tenderness: There is no abdominal tenderness.     Hernia: There is no hernia in the left inguinal area.  Musculoskeletal:        General: Normal range of motion.     Cervical back: Normal range of motion and neck supple.  Lymphadenopathy:     Cervical: No cervical adenopathy.  Skin:    General: Skin is warm and dry.     Findings: No rash.  Neurological:     Mental Status: He is alert and oriented to person, place, and time.     Sensory: No sensory deficit.     Deep Tendon Reflexes: Reflexes are normal and symmetric.  Psychiatric:        Mood and Affect: Mood is not anxious or depressed.      Wt Readings from Last 3 Encounters:  10/13/23 267 lb (121.1 kg)  10/03/23 267 lb (121.1 kg)  09/19/23 266 lb (120.7 kg)    BP 118/76   Pulse (!) 102   Ht 5' 10.5" (1.791 m)   Wt 267 lb (121.1 kg)   SpO2 94%   BMI 37.77 kg/m   Assessment and Plan: 1. Mixed hyperlipidemia (Primary) Chronic troponin genetics matter only symptom.  Tolerating medication without myalgias or muscle weakness.  Will continue atorvastatin 10 mg daily.  Will recheck - atorvastatin (LIPITOR) 10 MG tablet; Take 1 tablet (10 mg total) by mouth daily.  Dispense: 90 tablet; Refill: 1  2. Primary insomnia Chronic controlled.  Stable.  Controlled on current hydroxyzine 10 mg nightly continue current medical - hydrOXYzine (ATARAX) 10 MG tablet; Take 1 tablet (10 mg total) by mouth at bedtime as needed.  Dispense: 90 tablet; Refill: 1     Elizabeth Sauer, MD

## 2023-11-21 ENCOUNTER — Encounter: Payer: Self-pay | Admitting: Family Medicine

## 2023-11-22 ENCOUNTER — Ambulatory Visit: Admitting: Family Medicine

## 2023-11-22 ENCOUNTER — Encounter: Payer: Self-pay | Admitting: Family Medicine

## 2023-11-22 VITALS — BP 117/78 | HR 74 | Resp 16 | Ht 70.5 in | Wt 266.8 lb

## 2023-11-22 DIAGNOSIS — S76012A Strain of muscle, fascia and tendon of left hip, initial encounter: Secondary | ICD-10-CM | POA: Diagnosis not present

## 2023-11-22 MED ORDER — CYCLOBENZAPRINE HCL 5 MG PO TABS: 5.0000 mg | ORAL_TABLET | Freq: Three times a day (TID) | ORAL | 1 refills | Status: DC | PRN

## 2023-11-22 NOTE — Progress Notes (Addendum)
 Date:  11/22/2023   Name:  Richard Frost   DOB:  [birth year]   MRN:  [id number]   Chief Complaint: Back Pain (Pulled muscle in lower left back yesterday. )  Back Pain This is a new problem. The current episode started yesterday. The problem occurs constantly. The problem has been waxing and waning since onset. The pain is present in the lumbar spine. The quality of the pain is described as aching. The pain does not radiate. The pain is at a severity of 2/10. The pain is mild. The symptoms are aggravated by bending. Pertinent negatives include no abdominal pain, bladder incontinence, bowel incontinence, chest pain, leg pain, numbness, tingling or weakness.    Lab Results  Component Value Date   NA 142 09/29/2022   K 4.2 09/29/2022   CO2 23 09/29/2022   GLUCOSE 119 (H) 09/29/2022   BUN 15 09/29/2022   CREATININE 1.33 (H) 09/29/2022   CALCIUM 9.3 09/29/2022   EGFR 64 09/29/2022   GFRNONAA 68 05/07/2020   Lab Results  Component Value Date   CHOL 172 10/03/2023   HDL 60 10/03/2023   LDLCALC 90 10/03/2023   LDLDIRECT 87 10/03/2023   TRIG 126 10/03/2023   CHOLHDL 2.9 10/03/2023   Lab Results  Component Value Date   TSH 2.150 08/08/2015   Lab Results  Component Value Date   HGBA1C 5.7 (H) 01/31/2023   Lab Results  Component Value Date   WBC 6.7 04/04/2017   HGB 14.1 04/04/2017   HCT 42.9 04/04/2017   MCV 90.9 04/04/2017   PLT 178 04/04/2017   Lab Results  Component Value Date   ALT 39 10/03/2023   AST 31 10/03/2023   ALKPHOS 106 10/03/2023   BILITOT 0.8 10/03/2023   Lab Results  Component Value Date   VD25OH 27.0 (L) 11/06/2015     Review of Systems  Respiratory:  Negative for choking, chest tightness, shortness of breath and wheezing.   Cardiovascular:  Negative for chest pain.  Gastrointestinal:  Negative for abdominal pain and bowel incontinence.  Genitourinary:  Negative for bladder incontinence.  Musculoskeletal:  Positive for back pain.   Neurological:  Negative for tingling, weakness and numbness.    Patient Active Problem List   Diagnosis Date Noted   Insomnia 03/10/2023   Spondylosis of lumbosacral region without myelopathy or radiculopathy 11/24/2022   Colon cancer screening 05/14/2020   Biliary colic    Gallstones 08/19/2015   Fatty liver disease, nonalcoholic 08/19/2015   Vitamin D deficiency 08/11/2015   OCD (obsessive compulsive disorder) 08/08/2015   Allergic rhinitis 08/08/2015   Nephrolithiasis 08/08/2015   Prediabetes 08/08/2015   OSA treated with BiPAP 08/08/2015   Obesity, Class II, BMI 35-39.9, with comorbidity 08/08/2015   Tremor 12/04/2013    Allergies  Allergen Reactions   Sulfa Antibiotics Hives   Septra [Sulfamethoxazole-Trimethoprim] Hives    Past Surgical History:  Procedure Laterality Date   CHOLECYSTECTOMY N/A 04/05/2017   Procedure: LAPAROSCOPIC CHOLECYSTECTOMY WITH INTRAOPERATIVE CHOLANGIOGRAM;  Surgeon: Lattie Haw, MD;  Location: [facility] ORS;  Service: General;  Laterality: N/A;   COLONOSCOPY WITH PROPOFOL N/A 06/04/2020   Procedure: COLONOSCOPY WITH PROPOFOL;  Surgeon: Toney Reil, MD;  Location: [facility] ENDOSCOPY;  Service: Gastroenterology;  Laterality: N/A;   INTERNAL URETHROTOMY     remove kidney stone   LITHOTRIPSY     LITHOTRIPSY     x 7    Social History   Tobacco Use   Smoking status: Never  Smokeless tobacco: Never  Vaping Use   Vaping status: Never Used  Substance Use Topics   Alcohol use: Yes    Alcohol/week: 0.0 standard drinks of alcohol    Comment: 1 drink per month    Drug use: No     Medication list has been reviewed and updated.  Current Meds  Medication Sig   ARIPiprazole (ABILIFY) 20 MG tablet Take 20 mg by mouth at bedtime as needed.   atorvastatin (LIPITOR) 10 MG tablet Take 1 tablet (10 mg total) by mouth daily.   cetirizine (ZYRTEC) 10 MG tablet Take 10 mg by mouth at bedtime. otc   clonazePAM (KLONOPIN) 2 MG tablet Take 1  tablet by mouth at bedtime. cbc   docusate sodium (COLACE) 100 MG capsule Take 100 mg by mouth 2 (two) times daily.   fluvoxaMINE (LUVOX) 100 MG tablet Take 100 mg by mouth 2 (two) times daily. Cbc 100mg  in am and 150mg  qhs   hydrOXYzine (ATARAX) 10 MG tablet Take 1 tablet (10 mg total) by mouth at bedtime as needed.   ibuprofen (ADVIL) 600 MG tablet Take 1 tablet (600 mg total) by mouth every 8 (eight) hours as needed.   Omega 3 1000 MG CAPS Take 1 capsule (1,000 mg total) by mouth 2 (two) times daily.       10/13/2023   10:47 AM 09/19/2023    1:56 PM 08/11/2023    2:45 PM 06/14/2023    8:16 AM  GAD 7 : Generalized Anxiety Score  Nervous, Anxious, on Edge 2 0 2 3  Control/stop worrying 2 0 2 1  Worry too much - different things 2 0 2 1  Trouble relaxing 2 0 2 1  Restless 2 0 2 1  Easily annoyed or irritable 0 0 0 0  Afraid - awful might happen 2 0 0 0  Total GAD 7 Score 12 0 10 7  Anxiety Difficulty Very difficult Not difficult at all Somewhat difficult Not difficult at all       10/13/2023   10:46 AM 09/19/2023    1:56 PM 08/11/2023    2:45 PM  Depression screen PHQ 2/9  Decreased Interest 0 0 1  Down, Depressed, Hopeless 0 0 0  PHQ - 2 Score 0 0 1  Altered sleeping 0  1  Tired, decreased energy 0  1  Change in appetite 0  0  Feeling bad or failure about yourself  0  0  Trouble concentrating 0  1  Moving slowly or fidgety/restless 0  2  Suicidal thoughts 0  0  PHQ-9 Score 0  6  Difficult doing work/chores Not difficult at all  Somewhat difficult    BP Readings from Last 3 Encounters:  11/22/23 117/78  10/13/23 118/76  10/03/23 124/80    Physical Exam Vitals and nursing note reviewed.  Constitutional:      Appearance: He is well-developed.  HENT:     Head: Normocephalic and atraumatic.     Right Ear: Tympanic membrane, ear canal and external ear normal.     Left Ear: Tympanic membrane, ear canal and external ear normal.     Nose: Nose normal. No congestion or  rhinorrhea.     Mouth/Throat:     Mouth: Mucous membranes are moist.     Dentition: Normal dentition.  Eyes:     General: Lids are normal. No scleral icterus. Neck:     Thyroid: No thyromegaly.     Vascular: No carotid bruit, hepatojugular reflux  or JVD.     Trachea: No tracheal deviation.  Cardiovascular:     Rate and Rhythm: Normal rate and regular rhythm.     Heart sounds: Normal heart sounds. No murmur heard.    No gallop.  Pulmonary:     Effort: Pulmonary effort is normal.     Breath sounds: Normal breath sounds. No wheezing, rhonchi or rales.  Abdominal:     Palpations: Abdomen is soft. There is no hepatomegaly or splenomegaly.     Hernia: There is no hernia in the left inguinal area.  Musculoskeletal:        General: Normal range of motion.     Cervical back: Normal range of motion and neck supple.     Lumbar back: Negative left straight leg raise test.     Left hip: Tenderness present. No bony tenderness. Normal range of motion.     Left upper leg: Tenderness present. No swelling or bony tenderness.       Legs:     Comments: Tenderness gluteal  Lymphadenopathy:     Cervical: No cervical adenopathy.  Skin:    General: Skin is warm.     Findings: No rash.  Neurological:     Mental Status: He is alert.     Sensory: Sensation is intact.     Motor: Motor function is intact.     Deep Tendon Reflexes: Reflexes are normal and symmetric.     Reflex Scores:      Patellar reflexes are 2+ on the right side and 2+ on the left side. Psychiatric:        Mood and Affect: Mood is not anxious or depressed.     Wt Readings from Last 3 Encounters:  11/22/23 266 lb 12.8 oz (121 kg)  10/13/23 267 lb (121.1 kg)  10/03/23 267 lb (121.1 kg)    BP 117/78   Pulse 74   Resp 16   Ht 5' 10.5" (1.791 m)   Wt 266 lb 12.8 oz (121 kg)   SpO2 97%   BMI 37.74 kg/m   Assessment and Plan: 1. Muscle strain of left gluteal region, initial encounter (Primary) New onset.  Persistent.   Stable.  Patient in a twisting and bending effort to left that walker sustained a sudden pain in the left lumbar gluteal area. there is tenderness over this area.  Without radiation.  This is consistent with a gluteal strain perhaps the paraspinal involvement but there is no suggestion of lumbar disc disease neurologic exam is unremarkable with negative straight leg raise.    Alayne Allis, MD

## 2023-11-23 ENCOUNTER — Ambulatory Visit: Payer: 59 | Admitting: Internal Medicine

## 2023-12-05 ENCOUNTER — Other Ambulatory Visit: Payer: Self-pay | Admitting: Family Medicine

## 2023-12-07 NOTE — Telephone Encounter (Signed)
 Requested medication (s) are due for refill today: yes  Requested medication (s) are on the active medication list: yes  Last refill:  11/22/23  Future visit scheduled: no  Notes to clinic:  Unable to refill per protocol, cannot delegate.      Requested Prescriptions  Pending Prescriptions Disp Refills   cyclobenzaprine  (FLEXERIL ) 5 MG tablet [Pharmacy Med Name: CYCLOBENZAPRINE  HCL 5 MG TAB] 30 tablet 1    Sig: TAKE 1 TABLET BY MOUTH 3 TIMES DAILY AS NEEDED FOR MUSCLE SPASMS     Not Delegated - Analgesics:  Muscle Relaxants Failed - 12/07/2023  9:20 AM      Failed - This refill cannot be delegated      Passed - Valid encounter within last 6 months    Recent Outpatient Visits           2 weeks ago Muscle strain of left gluteal region, initial encounter   [facility] Kayla Part, MD   1 month ago Mixed hyperlipidemia   [place] [facility] Kayla Part, MD   2 months ago Acute non-recurrent frontal sinusitis   [place] [facility] Kayla Part, MD       Future Appointments             In 3 weeks Dunn, Elvia Hammans, PA-C [facility] [place]   In 4 months Barnetta Liberty, MD [facility] at [facility] [place]

## 2023-12-30 NOTE — Progress Notes (Unsigned)
 Cardiology Office Note    Date:  01/03/2024   ID:  Richard Frost, DOB [birth year], MRN [id number]  PCP:  Clarise Crooks, MD  Cardiologist:  None  Electrophysiologist:  None   Chief Complaint: Follow-up  History of Present Illness:   Richard Frost is a 53 y.o. male with history of aortic atherosclerosis, HLD, fatty liver disease, nephrolithiasis, prediabetes, obesity, and OSA on BiPAP who presents for follow up of calcium  score.   He was evaluated by PCP in 08/2023 for cough and sinusitis.  He was treated with Augmentin .  Subsequent chest x-ray on 08/11/2023 showed no acute cardiopulmonary process with aortic atherosclerosis noted.  In this setting, he was referred to cardiology at his request for further risk stratification.  He was seen on 10/03/2023 and was without symptoms of angina or cardiac decompensation.  Never a smoker.  To assist with further risk stratification, he underwent calcium  score CT in 09/2023 with a calcium  score of 0.  Review of CT imaging shows a small area of aortic atherosclerosis along the proximal descending thoracic aorta.  Otherwise no significant calcification or atherosclerosis noted.  He comes in doing well from a cardiac perspective and remains without symptoms of angina or cardiac decompensation.  No dizziness, presyncope, or syncope.  Tolerating atorvastatin  without off target effect.  Working on lifestyle modification.   Labs independently reviewed: 09/2023 - direct LDL 87, TC 172, TG 196, HDL 60, LDL 90, albumin 4.7, AST/ALT normal 01/2023 - A1c 5.7 09/2022 - BUN 15, serum creatinine 1.33, potassium 4.2 03/2017 - Hgb 14.1, PLT 178 07/2015 - TSH normal  Past Medical History:  Diagnosis Date   Allergic rhinitis 08/08/2015   Allergy    Anxiety    Biliary colic    Depression    Fatty liver disease, nonalcoholic 08/19/2015   Gallstones 08/19/2015   History of kidney stones    Nephrolithiasis 08/08/2015   Obesity, Class II, BMI 35-39.9,  with comorbidity 08/08/2015   OCD (obsessive compulsive disorder)    OSA treated with BiPAP 08/08/2015   Prediabetes 08/08/2015   Sleep apnea    cpap   Vitamin D  deficiency 08/11/2015    Past Surgical History:  Procedure Laterality Date   CHOLECYSTECTOMY N/A 04/05/2017   Procedure: LAPAROSCOPIC CHOLECYSTECTOMY WITH INTRAOPERATIVE CHOLANGIOGRAM;  Surgeon: Claudia Cuff, MD;  Location: [facility] ORS;  Service: General;  Laterality: N/A;   COLONOSCOPY WITH PROPOFOL  N/A 06/04/2020   Procedure: COLONOSCOPY WITH PROPOFOL ;  Surgeon: Selena Daily, MD;  Location: [facility] ENDOSCOPY;  Service: Gastroenterology;  Laterality: N/A;   INTERNAL URETHROTOMY     remove kidney stone   LITHOTRIPSY     LITHOTRIPSY     x 7    Current Medications: Current Meds  Medication Sig   ARIPiprazole (ABILIFY) 20 MG tablet Take 20 mg by mouth at bedtime as needed.   atorvastatin  (LIPITOR) 10 MG tablet Take 1 tablet (10 mg total) by mouth daily.   cetirizine (ZYRTEC) 10 MG tablet Take 10 mg by mouth at bedtime. otc   clonazePAM (KLONOPIN) 2 MG tablet Take 1 tablet by mouth at bedtime. cbc   cyclobenzaprine  (FLEXERIL ) 5 MG tablet Take 1 tablet (5 mg total) by mouth 3 (three) times daily as needed for muscle spasms.   docusate sodium (COLACE) 100 MG capsule Take 100 mg by mouth 2 (two) times daily.   fluvoxaMINE (LUVOX) 100 MG tablet Take 100 mg by mouth 2 (two) times daily. Cbc 100mg  in am and 150mg  qhs  hydrOXYzine  (ATARAX ) 10 MG tablet Take 1 tablet (10 mg total) by mouth at bedtime as needed.   ibuprofen  (ADVIL ) 600 MG tablet Take 1 tablet (600 mg total) by mouth every 8 (eight) hours as needed.   Omega 3 1000 MG CAPS Take 1 capsule (1,000 mg total) by mouth 2 (two) times daily.    Allergies:   Sulfa antibiotics and Septra [sulfamethoxazole-trimethoprim]   Social History   Socioeconomic History   Marital status: Married    Spouse name: Not on file   Number of children: Not on file   Years of education:  Not on file   Highest education level: Bachelor's degree (e.g., BA, AB, BS)  Occupational History   Not on file  Tobacco Use   Smoking status: Never   Smokeless tobacco: Never  Vaping Use   Vaping status: Never Used  Substance and Sexual Activity   Alcohol use: Yes    Alcohol/week: 0.0 standard drinks of alcohol    Comment: 1 drink per month    Drug use: No   Sexual activity: Not Currently  Other Topics Concern   Not on file  Social History Narrative   ** Merged History Encounter **       Social Drivers of Health   Financial Resource Strain: Low Risk  (08/09/2023)   Overall Financial Resource Strain (CARDIA)    Difficulty of Paying Living Expenses: Not hard at all  Food Insecurity: No Food Insecurity (09/19/2023)   Hunger Vital Sign    Worried About Running Out of Food in the Last Year: Never true    Ran Out of Food in the Last Year: Never true  Transportation Needs: No Transportation Needs (09/19/2023)   PRAPARE - Administrator, Civil Service (Medical): No    Lack of Transportation (Non-Medical): No  Physical Activity: Insufficiently Active (08/09/2023)   Exercise Vital Sign    Days of Exercise per Week: 1 day    Minutes of Exercise per Session: 10 min  Stress: Stress Concern Present (08/09/2023)   [employer] of Occupational Health - Occupational Stress Questionnaire    Feeling of Stress : To some extent  Social Connections: Socially Integrated (08/09/2023)   Social Connection and Isolation Panel [NHANES]    Frequency of Communication with Friends and Family: Twice a week    Frequency of Social Gatherings with Friends and Family: Once a week    Attends Religious Services: More than 4 times per year    Active Member of [employer] or Organizations: Yes    Attends Engineer, structural: More than 4 times per year    Marital Status: Married     Family History:  The patient's family history includes Cancer in his maternal aunt; Diabetes in his  paternal aunt and paternal grandmother; Heart attack in his maternal grandfather; Heart disease in his paternal grandfather; Heart failure in his paternal grandfather; Hyperlipidemia in his father; Hypertension in his father and sister; Osteoarthritis in his father and mother.  ROS:   12-point review of systems is negative unless otherwise noted in the HPI.   EKGs/Labs/Other Studies Reviewed:    Studies reviewed were summarized above. The additional studies were reviewed today:  Calcium  score 10/07/2023: Ascending Aorta: Normal size   Pericardium: Normal   Coronary arteries: Normal origin of left and right coronary arteries. Distribution of arterial calcifications if present, as noted below;   LM 0   LAD 0   LCx 0   RCA 0   Total  0   IMPRESSION AND RECOMMENDATION: 1. Coronary calcium  score of 0. 2. CAC 0, CAC-DRS A0. 3. Continue heart healthy lifestyle and risk factor modification.   EKG:  EKG is not ordered today.    Recent Labs: 10/03/2023: ALT 39  Recent Lipid Panel    Component Value Date/Time   CHOL 172 10/03/2023 1221   TRIG 126 10/03/2023 1221   HDL 60 10/03/2023 1221   CHOLHDL 2.9 10/03/2023 1221   LDLCALC 90 10/03/2023 1221   LDLDIRECT 87 10/03/2023 1221    PHYSICAL EXAM:    VS:  BP 122/80   Pulse 82   Ht 5' 10.5" (1.791 m)   Wt 264 lb 12.8 oz (120.1 kg)   SpO2 97%   BMI 37.46 kg/m   BMI: Body mass index is 37.46 kg/m.  Physical Exam Vitals reviewed.  Constitutional:      Appearance: He is well-developed.  HENT:     Head: Normocephalic and atraumatic.  Eyes:     General:        Right eye: No discharge.        Left eye: No discharge.  Cardiovascular:     Rate and Rhythm: Normal rate and regular rhythm.     Heart sounds: Normal heart sounds, S1 normal and S2 normal. Heart sounds not distant. No midsystolic click and no opening snap. No murmur heard.    No friction rub.  Pulmonary:     Effort: Pulmonary effort is normal. No respiratory  distress.     Breath sounds: Normal breath sounds. No decreased breath sounds, wheezing, rhonchi or rales.  Chest:     Chest wall: No tenderness.  Musculoskeletal:     Cervical back: Normal range of motion.  Skin:    General: Skin is warm and dry.     Nails: There is no clubbing.  Neurological:     Mental Status: He is alert and oriented to person, place, and time.  Psychiatric:        Speech: Speech normal.        Behavior: Behavior normal.        Thought Content: Thought content normal.        Judgment: Judgment normal.     Wt Readings from Last 3 Encounters:  01/03/24 264 lb 12.8 oz (120.1 kg)  11/22/23 266 lb 12.8 oz (121 kg)  10/13/23 267 lb (121.1 kg)     ASSESSMENT & PLAN:   Aortic atherosclerosis/HLD: Calcium  score 0 in 09/2023.  Small area of aortic atherosclerosis along the proximal descending thoracic aorta.  LDL 87 in 09/2023.  Target LDL less than 70 with aortic atherosclerosis.  Continue with lifestyle modification and atorvastatin  10 mg daily.  Obesity with OSA: Weight loss is encouraged through heart healthy diet and regular exercise.  Remains adherent to BiPAP.     Disposition: F/u with me in 12 months.   Medication Adjustments/Labs and Tests Ordered: Current medicines are reviewed at length with the patient today.  Concerns regarding medicines are outlined above. Medication changes, Labs and Tests ordered today are summarized above and listed in the Patient Instructions accessible in Encounters.   SignedVarney Gentleman, PA-C 01/03/2024 11:49 AM     [facility] [place] [address] [place], [place] [address] [phone]

## 2024-01-03 ENCOUNTER — Ambulatory Visit: Payer: 59 | Attending: Physician Assistant | Admitting: Physician Assistant

## 2024-01-03 ENCOUNTER — Encounter: Payer: Self-pay | Admitting: Physician Assistant

## 2024-01-03 VITALS — BP 122/80 | HR 82 | Ht 70.5 in | Wt 264.8 lb

## 2024-01-03 DIAGNOSIS — G4733 Obstructive sleep apnea (adult) (pediatric): Secondary | ICD-10-CM

## 2024-01-03 DIAGNOSIS — E66812 Obesity, class 2: Secondary | ICD-10-CM | POA: Diagnosis not present

## 2024-01-03 DIAGNOSIS — Z6837 Body mass index (BMI) 37.0-37.9, adult: Secondary | ICD-10-CM

## 2024-01-03 DIAGNOSIS — I700 Atherosclerosis of aorta: Secondary | ICD-10-CM

## 2024-01-03 DIAGNOSIS — E785 Hyperlipidemia, unspecified: Secondary | ICD-10-CM | POA: Diagnosis not present

## 2024-01-03 NOTE — Patient Instructions (Signed)
 Medication Instructions:  Your physician recommends that you continue on your current medications as directed. Please refer to the Current Medication list given to you today.   *If you need a refill on your cardiac medications before your next appointment, please call your pharmacy*  Lab Work: No labs ordered today   Testing/Procedures: No test ordered today   Follow-Up: At [facility], you and your health needs are our priority.  As part of our continuing mission to provide you with exceptional heart care, our providers are all part of one team.  This team includes your primary Cardiologist (physician) and Advanced Practice Providers or APPs (Physician Assistants and Nurse Practitioners) who all work together to provide you with the care you need, when you need it.  Your next appointment:   1 year(s)  Provider:   Varney Gentleman, PA-C

## 2024-01-11 LAB — HM DIABETES EYE EXAM

## 2024-01-12 ENCOUNTER — Encounter: Payer: Self-pay | Admitting: Student

## 2024-01-12 NOTE — Telephone Encounter (Signed)
 Please contact patient to schedule appt to see r. Augustus Ledger.

## 2024-01-17 ENCOUNTER — Encounter: Payer: Self-pay | Admitting: Family Medicine

## 2024-01-17 ENCOUNTER — Ambulatory Visit: Admitting: Family Medicine

## 2024-01-17 VITALS — BP 108/70 | HR 84 | Ht 70.5 in | Wt 265.2 lb

## 2024-01-17 DIAGNOSIS — M7711 Lateral epicondylitis, right elbow: Secondary | ICD-10-CM | POA: Insufficient documentation

## 2024-01-17 MED ORDER — MELOXICAM 15 MG PO TABS
15.0000 mg | ORAL_TABLET | Freq: Every day | ORAL | 0 refills | Status: DC
Start: 2024-01-17 — End: 2024-02-16

## 2024-01-17 NOTE — Patient Instructions (Signed)
 Patient Action Plan  For Lateral Epicondylitis (Tennis Elbow): - Take meloxicam  15 mg once daily with food to help reduce inflammation. - Stop taking ibuprofen  while you are on meloxicam . - Use Tylenol  for additional pain relief if needed. - Apply heat to your muscles and ice to your elbow for 20 minutes each as needed. - Begin gentle stretching and strengthening exercises one week after starting the medication, following the provided printout. - Use a wrist brace during activities if it helps relieve pain, but avoid wearing it during rest. - Attend a follow-up appointment in one month to reassess your condition and discuss possible injection therapy if there is no improvement.  Red Flags: - If you experience increased pain, swelling, or any new symptoms, contact your healthcare provider immediately.

## 2024-01-17 NOTE — Progress Notes (Signed)
 Primary Care / Sports Medicine Office Visit  Patient Information:  Patient ID: Richard Frost, male DOB: Dec 12, 1970 Age: 53 y.o. MRN: [id number]   Richard Frost is a pleasant 53 y.o. male presenting with the following:  Chief Complaint  Patient presents with   Arm Pain    Right arm pain x 2-3 weeks. Started in right long finger and travels up to elbow. Difficulty with gripping , grasping, and squeezing.  NKI. Thinks he may have tore something. Patient taking IBU for pain and it has been helping some.     Vitals:   01/17/24 1339  BP: 108/70  Pulse: 84  SpO2: 95%   Vitals:   01/17/24 1339  Weight: 265 lb 3.2 oz (120.3 kg)  Height: 5' 10.5" (1.791 m)   Body mass index is 37.51 kg/m.  No results found.   Independent interpretation of notes and tests performed by another provider:   None  Procedures performed:   None  Pertinent History, Exam, Impression, and Recommendations:   Problem List Items Addressed This Visit     Lateral epicondylitis, right elbow - Primary   History of Present Illness Richard Frost "Richard Frost" is a 53 year old male who presents with right elbow pain and difficulty gripping due to suspected lateral epicondylitis.  He has been experiencing pain in his right long finger that radiates up to the elbow, making it difficult to grip, grasp, or squeeze. This has been ongoing for about two to three weeks. The pain is primarily located in the forearm, particularly the mid aspect, and does not extend above the elbow.  He has been using ibuprofen , taking 400 mg at night, which has provided some relief. Additionally, he purchased an ace brace for his wrist, which he wears at night and finds helpful in managing his symptoms.  No numbness or tingling in the affected area, specifically in the thumb, index, and middle fingers. No numbness or tingling in the elbow down distribution.  Physical Exam INSPECTION: Symmetric, no abnormalities  to inspection PALPATION: Tenderness at the lateral epicondyle on the right SPECIAL TESTS: Positive provocative testing for lateral epicondylitis. Negative provocative testing for medial epicondylitis. Negative Tinel's and Phalen's test on the right  Assessment and Plan Lateral epicondylitis (tennis elbow) Lateral epicondylitis in the right arm due to overuse and deconditioning, causing inflammation and tendon fraying. Negative for carpal tunnel syndrome and medial epicondylitis. Ibuprofen  provided partial relief. Not severe enough for surgery. - Prescribed meloxicam  15 mg once daily with food for inflammation control. - Discontinued ibuprofen  while on meloxicam . - Recommended Tylenol  for additional pain relief if needed. - Advised heat application to muscles and ice to lateral epicondyle as needed, 20 minutes each. - Provided printout of gentle stretching and strengthening exercises to start after one week of medication. - Advised wrist brace during activities if it provides pain relief, not during rest. - Scheduled follow-up in one month to reassess and consider injection therapy if no improvement.      Relevant Medications   meloxicam  (MOBIC ) 15 MG tablet     Orders & Medications Medications:  Meds ordered this encounter  Medications   meloxicam  (MOBIC ) 15 MG tablet    Sig: Take 1 tablet (15 mg total) by mouth daily. X 1-2 weeks then daily PRN    Dispense:  30 tablet    Refill:  0   No orders of the defined types were placed in this encounter.    Return in about 4  weeks (around 02/14/2024).     Ma Saupe, MD, [facility]   Primary Care Sports Medicine Primary Care and Sports Medicine at [facility]

## 2024-01-17 NOTE — Assessment & Plan Note (Signed)
 History of Present Illness Richard Frost "Richard Frost" is a 53 year old male who presents with right elbow pain and difficulty gripping due to suspected lateral epicondylitis.  He has been experiencing pain in his right long finger that radiates up to the elbow, making it difficult to grip, grasp, or squeeze. This has been ongoing for about two to three weeks. The pain is primarily located in the forearm, particularly the mid aspect, and does not extend above the elbow.  He has been using ibuprofen , taking 400 mg at night, which has provided some relief. Additionally, he purchased an ace brace for his wrist, which he wears at night and finds helpful in managing his symptoms.  No numbness or tingling in the affected area, specifically in the thumb, index, and middle fingers. No numbness or tingling in the elbow down distribution.  Physical Exam INSPECTION: Symmetric, no abnormalities to inspection PALPATION: Tenderness at the lateral epicondyle on the right SPECIAL TESTS: Positive provocative testing for lateral epicondylitis. Negative provocative testing for medial epicondylitis. Negative Tinel's and Phalen's test on the right  Assessment and Plan Lateral epicondylitis (tennis elbow) Lateral epicondylitis in the right arm due to overuse and deconditioning, causing inflammation and tendon fraying. Negative for carpal tunnel syndrome and medial epicondylitis. Ibuprofen  provided partial relief. Not severe enough for surgery. - Prescribed meloxicam  15 mg once daily with food for inflammation control. - Discontinued ibuprofen  while on meloxicam . - Recommended Tylenol  for additional pain relief if needed. - Advised heat application to muscles and ice to lateral epicondyle as needed, 20 minutes each. - Provided printout of gentle stretching and strengthening exercises to start after one week of medication. - Advised wrist brace during activities if it provides pain relief, not during rest. -  Scheduled follow-up in one month to reassess and consider injection therapy if no improvement.

## 2024-02-16 ENCOUNTER — Ambulatory Visit: Admitting: Family Medicine

## 2024-02-16 ENCOUNTER — Encounter: Payer: Self-pay | Admitting: Family Medicine

## 2024-02-16 DIAGNOSIS — M7711 Lateral epicondylitis, right elbow: Secondary | ICD-10-CM | POA: Diagnosis not present

## 2024-02-16 MED ORDER — MELOXICAM 15 MG PO TABS
15.0000 mg | ORAL_TABLET | Freq: Every day | ORAL | 0 refills | Status: AC | PRN
Start: 2024-02-16 — End: 2024-03-17

## 2024-02-16 NOTE — Patient Instructions (Signed)
 Patient Action Plan  1. Pain Management for Lateral Epicondylitis:    - Take meloxicam  only as needed for pain. Discontinue regular use and restart if symptoms worsen. Take with food if needed.    - Apply Voltaren gel up to four times daily for nighttime symptoms.  2. Preventive Measures:    - Perform wrist stretching exercises regularly to address muscle tightness and prevent recurrence.  Red Flags: - Contact the clinic if symptoms worsen or if frequent use of meloxicam  is needed, as a corticosteroid injection may be considered.

## 2024-02-16 NOTE — Progress Notes (Signed)
     Primary Care / Sports Medicine Office Visit  Patient Information:  Patient ID: Richard Frost, male DOB: [birth year] Age: 53 y.o. MRN: [id number]   Richard Frost is a pleasant 53 y.o. male presenting with the following:  Chief Complaint  Patient presents with   Elbow Pain    Right elbow continues to have some pain. Aggravating factors pulling sheets up when going to bed, and over use. Patient has been taking Meloxicam  and doing home exercises which have helped a lot.     Vitals:   02/16/24 1357  BP: 94/68  Pulse: 65  SpO2: 95%   Vitals:   02/16/24 1357  Weight: 271 lb 3.2 oz (123 kg)  Height: 5' 10.5 (1.791 m)   Body mass index is 38.36 kg/m.  No results found.   Independent interpretation of notes and tests performed by another provider:   None  Procedures performed:   None  Pertinent History, Exam, Impression, and Recommendations:   Problem List Items Addressed This Visit     Lateral epicondylitis, right elbow   History of Present Illness The patient presents with lateral epicondylitis for follow-up of elbow pain.  Lateral elbow pain - Elbow pain localized to the lateral aspect, primarily occurring at night a couple of times per week - Frequency and severity of pain episodes have decreased since the last visit - Pain is provoked by repetitive activities such as pulling sheets - No pain during normal daily activities  Muscle tightness - Increased tightness in the extensor wrist and forearm muscles on the affected side compared to the contralateral side  Symptom management - Consistent use of meloxicam  with symptomatic improvement  Physical Exam Left elbow: Nontender lateral epicondyle, full, painless active range of motion at elbow, wrist flexion slightly decreased compared to contralateral, provocative testing negative for epicondylitis.  Assessment and Plan Lateral epicondylitis -improving Significant symptom improvement with  reduced pain frequency and severity. Meloxicam  effective but aim to minimize use. Discussed muscle tightness and repetitive activity as exacerbating factors. Emphasized stretching exercises to prevent recurrence. - Prescribe meloxicam  to now take as needed for pain. Advise discontinuation of regular use, restart if symptoms worsen.  If taking, take with food. - Recommend Voltaren gel up to four times daily for nighttime symptoms. - Instruct on wrist stretching exercises to address muscle tightness. Encourage regular practice. - Advise to contact if symptoms worsen or frequent meloxicam  use is needed; consider corticosteroid injection.  Contact us  if that is the case.      Relevant Medications   meloxicam  (MOBIC ) 15 MG tablet     Orders & Medications Medications:  Meds ordered this encounter  Medications   meloxicam  (MOBIC ) 15 MG tablet    Sig: Take 1 tablet (15 mg total) by mouth daily as needed for pain.    Dispense:  30 tablet    Refill:  0   No orders of the defined types were placed in this encounter.    No follow-ups on file.     Richard JINNY Ku, MD, [facility]   Primary Care Sports Medicine Primary Care and Sports Medicine at [facility]

## 2024-02-16 NOTE — Assessment & Plan Note (Signed)
 History of Present Illness The patient presents with lateral epicondylitis for follow-up of elbow pain.  Lateral elbow pain - Elbow pain localized to the lateral aspect, primarily occurring at night a couple of times per week - Frequency and severity of pain episodes have decreased since the last visit - Pain is provoked by repetitive activities such as pulling sheets - No pain during normal daily activities  Muscle tightness - Increased tightness in the extensor wrist and forearm muscles on the affected side compared to the contralateral side  Symptom management - Consistent use of meloxicam  with symptomatic improvement  Physical Exam Left elbow: Nontender lateral epicondyle, full, painless active range of motion at elbow, wrist flexion slightly decreased compared to contralateral, provocative testing negative for epicondylitis.  Assessment and Plan Lateral epicondylitis -improving Significant symptom improvement with reduced pain frequency and severity. Meloxicam  effective but aim to minimize use. Discussed muscle tightness and repetitive activity as exacerbating factors. Emphasized stretching exercises to prevent recurrence. - Prescribe meloxicam  to now take as needed for pain. Advise discontinuation of regular use, restart if symptoms worsen.  If taking, take with food. - Recommend Voltaren gel up to four times daily for nighttime symptoms. - Instruct on wrist stretching exercises to address muscle tightness. Encourage regular practice. - Advise to contact if symptoms worsen or frequent meloxicam  use is needed; consider corticosteroid injection.  Contact us  if that is the case.

## 2024-04-18 ENCOUNTER — Ambulatory Visit: Admitting: Student

## 2024-04-18 ENCOUNTER — Encounter: Payer: Self-pay | Admitting: Student

## 2024-04-18 VITALS — BP 128/79 | HR 86 | Ht 70.5 in | Wt 268.0 lb

## 2024-04-18 DIAGNOSIS — R7303 Prediabetes: Secondary | ICD-10-CM

## 2024-04-18 DIAGNOSIS — E782 Mixed hyperlipidemia: Secondary | ICD-10-CM

## 2024-04-18 DIAGNOSIS — J3089 Other allergic rhinitis: Secondary | ICD-10-CM

## 2024-04-18 DIAGNOSIS — K760 Fatty (change of) liver, not elsewhere classified: Secondary | ICD-10-CM

## 2024-04-18 DIAGNOSIS — Z23 Encounter for immunization: Secondary | ICD-10-CM | POA: Diagnosis not present

## 2024-04-18 DIAGNOSIS — F5101 Primary insomnia: Secondary | ICD-10-CM

## 2024-04-18 DIAGNOSIS — F422 Mixed obsessional thoughts and acts: Secondary | ICD-10-CM

## 2024-04-18 DIAGNOSIS — G4733 Obstructive sleep apnea (adult) (pediatric): Secondary | ICD-10-CM

## 2024-04-18 DIAGNOSIS — K5900 Constipation, unspecified: Secondary | ICD-10-CM | POA: Insufficient documentation

## 2024-04-18 DIAGNOSIS — I2510 Atherosclerotic heart disease of native coronary artery without angina pectoris: Secondary | ICD-10-CM | POA: Insufficient documentation

## 2024-04-18 DIAGNOSIS — Z6837 Body mass index (BMI) 37.0-37.9, adult: Secondary | ICD-10-CM

## 2024-04-18 DIAGNOSIS — E66812 Obesity, class 2: Secondary | ICD-10-CM

## 2024-04-18 MED ORDER — COVID-19 MRNA VAC-TRIS(PFIZER) 30 MCG/0.3ML IM SUSY: 0.3000 mL | PREFILLED_SYRINGE | Freq: Once | INTRAMUSCULAR | 0 refills | Status: DC

## 2024-04-18 MED ORDER — ATORVASTATIN CALCIUM 10 MG PO TABS
10.0000 mg | ORAL_TABLET | Freq: Every day | ORAL | 1 refills | Status: AC
Start: 2024-04-18 — End: ?

## 2024-04-18 MED ORDER — COVID-19 MRNA VAC-TRIS(PFIZER) 30 MCG/0.3ML IM SUSY: 0.3000 mL | PREFILLED_SYRINGE | Freq: Once | INTRAMUSCULAR | 0 refills | Status: AC

## 2024-04-18 MED ORDER — HYDROXYZINE HCL 10 MG PO TABS
10.0000 mg | ORAL_TABLET | Freq: Every evening | ORAL | 1 refills | Status: AC | PRN
Start: 2024-04-18 — End: ?

## 2024-04-18 NOTE — Assessment & Plan Note (Signed)
 Sleeping well withy hydroxyzine  10 mg daily will refill this today.

## 2024-04-18 NOTE — Assessment & Plan Note (Signed)
 Well controlled on zyrtec 10 mg daily. Allergies to dust mites, cat, and grass,

## 2024-04-18 NOTE — Assessment & Plan Note (Signed)
 Takes colace regularly for constipation, recommended adding on fiber supplement.

## 2024-04-18 NOTE — Assessment & Plan Note (Signed)
 [facility]

## 2024-04-18 NOTE — Assessment & Plan Note (Signed)
 Denies polydipsia or polyuria.  Lab Results  Component Value Date   HGBA1C 5.7 (H) 01/31/2023   HGBA1C 5.6 09/29/2022   HGBA1C 5.7 (H) 03/29/2022  A1c today. Recommended diet and exercise modifications.

## 2024-04-18 NOTE — Assessment & Plan Note (Addendum)
 Is generally sedentary. Did walk more than usual on recent trip to [place]. Avoids eating animal products. Has difficulty with diet due to recurring calcium  oxalate stones. Referral made to RD.

## 2024-04-18 NOTE — Assessment & Plan Note (Addendum)
 Continue diet and exercise. Check CMP and CBC today.

## 2024-04-18 NOTE — Progress Notes (Signed)
 Established Patient Office Visit  Subjective   Patient ID: Richard Frost, male    DOB: 12-29-1970  Age: 53 y.o. MRN: [id number]  Chief Complaint  Patient presents with   Hyperlipidemia   Insomnia    Belvie Glendia Sprinkle with medical hx listed below presents today for transfer of care. Would like COVID vaccine today. No acute complaints today. Please refer to problem based charting for further details and assessment and plan of current problem and chronic medical conditions.   Patient Active Problem List   Diagnosis Date Noted   Constipation 04/18/2024   CAD (coronary artery disease) 04/18/2024   Lateral epicondylitis, right elbow 01/17/2024   Insomnia 03/10/2023   Spondylosis of lumbosacral region without myelopathy or radiculopathy 11/24/2022   Colon cancer screening 05/14/2020   Biliary colic    Fatty liver disease, nonalcoholic 08/19/2015   Vitamin D  deficiency 08/11/2015   OCD (obsessive compulsive disorder) 08/08/2015   Allergic rhinitis 08/08/2015   Nephrolithiasis 08/08/2015   Prediabetes 08/08/2015   OSA treated with BiPAP 08/08/2015   Obesity 08/08/2015   Tremor 12/04/2013   Past Medical History:  Diagnosis Date   Allergic rhinitis 08/08/2015   Allergy    Anxiety    Biliary colic    Depression    Fatty liver disease, nonalcoholic 08/19/2015   Gallstones 08/19/2015   History of kidney stones    Nephrolithiasis 08/08/2015   Obesity, Class II, BMI 35-39.9, with comorbidity 08/08/2015   OCD (obsessive compulsive disorder)    OSA treated with BiPAP 08/08/2015   Prediabetes 08/08/2015   Sleep apnea    cpap   Vitamin D  deficiency 08/11/2015      ROS Refer to HPI    Objective:     Outpatient Encounter Medications as of 04/18/2024  Medication Sig Note   ARIPiprazole (ABILIFY) 20 MG tablet Take 20 mg by mouth at bedtime as needed.    cetirizine (ZYRTEC) 10 MG tablet Take 10 mg by mouth at bedtime. otc    clonazePAM (KLONOPIN) 2 MG tablet Take 1  tablet by mouth at bedtime. cbc    docusate sodium (COLACE) 100 MG capsule Take 100 mg by mouth 2 (two) times daily.    fluvoxaMINE (LUVOX) 100 MG tablet Take 100 mg by mouth 2 (two) times daily. Cbc 100mg  in am and 150mg  qhs    ibuprofen  (ADVIL ) 600 MG tablet Take 1 tablet (600 mg total) by mouth every 8 (eight) hours as needed. 01/17/2024: PRN   Omega 3 1000 MG CAPS Take 1 capsule (1,000 mg total) by mouth 2 (two) times daily.    [DISCONTINUED] atorvastatin  (LIPITOR) 10 MG tablet Take 1 tablet (10 mg total) by mouth daily.    [DISCONTINUED] COVID-19 mRNA vaccine, Pfizer, (COMIRNATY) syringe Inject 0.3 mLs into the muscle once for 1 dose.    [DISCONTINUED] cyclobenzaprine  (FLEXERIL ) 5 MG tablet Take 1 tablet (5 mg total) by mouth 3 (three) times daily as needed for muscle spasms. 01/17/2024: PRN   [DISCONTINUED] hydrOXYzine  (ATARAX ) 10 MG tablet Take 1 tablet (10 mg total) by mouth at bedtime as needed.    atorvastatin  (LIPITOR) 10 MG tablet Take 1 tablet (10 mg total) by mouth daily.    COVID-19 mRNA vaccine, Pfizer, (COMIRNATY) syringe Inject 0.3 mLs into the muscle once for 1 dose.    hydrOXYzine  (ATARAX ) 10 MG tablet Take 1 tablet (10 mg total) by mouth at bedtime as needed.    [DISCONTINUED] COVID-19 mRNA vaccine, Pfizer, (COMIRNATY) syringe Inject 0.3 mLs into the  muscle once for 1 dose.    No facility-administered encounter medications on file as of 04/18/2024.    BP 128/79 (BP Location: Left Arm)   Pulse 86   Ht 5' 10.5 (1.791 m)   Wt 268 lb (121.6 kg)   SpO2 94%   BMI 37.91 kg/m  BP Readings from Last 3 Encounters:  04/18/24 128/79  02/16/24 94/68  01/17/24 108/70    Physical Exam Constitutional:      Appearance: He is obese.  HENT:     Head: Normocephalic and atraumatic.  Eyes:     Extraocular Movements: Extraocular movements intact.     Pupils: Pupils are equal, round, and reactive to light.  Cardiovascular:     Rate and Rhythm: Normal rate and regular rhythm.   Pulmonary:     Effort: Pulmonary effort is normal.     Breath sounds: No rhonchi or rales.  Abdominal:     General: Abdomen is flat. Bowel sounds are normal. There is no distension.     Palpations: Abdomen is soft.     Tenderness: There is no abdominal tenderness.  Musculoskeletal:        General: Normal range of motion.     Right lower leg: No edema.     Left lower leg: No edema.  Skin:    General: Skin is warm and dry.     Capillary Refill: Capillary refill takes less than 2 seconds.  Neurological:     General: No focal deficit present.     Mental Status: He is alert and oriented to person, place, and time.  Psychiatric:        Mood and Affect: Mood normal.        Behavior: Behavior normal.        04/18/2024    8:46 AM 02/16/2024    2:03 PM 01/17/2024    1:44 PM  Depression screen PHQ 2/9  Decreased Interest 0 0 1  Down, Depressed, Hopeless 0 0 2  PHQ - 2 Score 0 0 3  Altered sleeping 0  0  Tired, decreased energy 0  1  Change in appetite 0  0  Feeling bad or failure about yourself  0  0  Trouble concentrating 0  1  Moving slowly or fidgety/restless 0  3  Suicidal thoughts 0  0  PHQ-9 Score 0  8  Difficult doing work/chores Not difficult at all  Somewhat difficult       04/18/2024    8:46 AM 01/17/2024    1:44 PM 10/13/2023   10:47 AM 09/19/2023    1:56 PM  GAD 7 : Generalized Anxiety Score  Nervous, Anxious, on Edge 3 3 2  0  Control/stop worrying 3 2 2  0  Worry too much - different things 3 2 2  0  Trouble relaxing 0 2 2 0  Restless 0 2 2 0  Easily annoyed or irritable 0 0 0 0  Afraid - awful might happen 0 0 2 0  Total GAD 7 Score 9 11 12  0  Anxiety Difficulty Somewhat difficult Somewhat difficult Very difficult Not difficult at all    No results found for any visits on 04/18/24.  Last CBC Lab Results  Component Value Date   WBC 6.7 04/04/2017   HGB 14.1 04/04/2017   HCT 42.9 04/04/2017   MCV 90.9 04/04/2017   MCH 29.9 04/04/2017   RDW 14.1  04/04/2017   PLT 178 04/04/2017      The 10-year ASCVD risk score (Arnett  DK, et al., 2019) is: 3.6%    Assessment & Plan:  Non-seasonal allergic rhinitis due to other allergic trigger Assessment & Plan: Well controlled on zyrtec 10 mg daily. Allergies to dust mites, cat, and grass,    Mixed hyperlipidemia -     Atorvastatin  Calcium ; Take 1 tablet (10 mg total) by mouth daily.  Dispense: 90 tablet; Refill: 1 -     Lipid panel  Primary insomnia Assessment & Plan: Sleeping well withy hydroxyzine  10 mg daily will refill this today.   Orders: -     hydrOXYzine  HCl; Take 1 tablet (10 mg total) by mouth at bedtime as needed.  Dispense: 90 tablet; Refill: 1  Constipation, unspecified constipation type Assessment & Plan: Takes colace regularly for constipation, recommended adding on fiber supplement.    Mixed obsessional thoughts and acts Assessment & Plan: [facility]    OSA treated with BiPAP Assessment & Plan: Doing well with BiPAP. Continue with this.    Class 2 severe obesity due to excess calories with serious comorbidity and body mass index (BMI) of 37.0 to 37.9 in adult [facility]) Assessment & Plan: Is generally sedentary. Did walk more than usual on recent trip to [place]. Avoids eating animal products. Has difficulty with diet due to recurring calcium  oxalate stones. Referral made to RD.   Orders: -     Referral to Nutrition and Diabetes Services  Fatty liver disease, nonalcoholic Assessment & Plan: Continue diet and exercise. Check CMP and CBC today.  Orders: -     Comprehensive metabolic panel with GFR -     CBC with Differential/Platelet  Prediabetes Assessment & Plan: Denies polydipsia or polyuria.  Lab Results  Component Value Date   HGBA1C 5.7 (H) 01/31/2023   HGBA1C 5.6 09/29/2022   HGBA1C 5.7 (H) 03/29/2022  A1c today. Recommended diet and exercise modifications.  Orders: -     Hemoglobin A1c  Encounter for immunization -     Flu  vaccine trivalent PF, 6mos and older(Flulaval,Afluria,Fluarix,Fluzone)  Coronary artery disease involving native coronary artery of native heart without angina pectoris Assessment & Plan: Lipid Panel     Component Value Date/Time   CHOL 172 10/03/2023 1221   TRIG 126 10/03/2023 1221   HDL 60 10/03/2023 1221   CHOLHDL 2.9 10/03/2023 1221   LDLCALC 90 10/03/2023 1221   LDLDIRECT 87 10/03/2023 1221   LABVLDL 22 10/03/2023 1221  No anginal symptoms. The 10-year ASCVD risk score (Arnett DK, et al., 2019) is: 3.6%. Currently on atorvastatin  10 mg daily. He is fasting today. Repeat lipid panel. If LDL remains >70 will increase statin.     Other orders -     COVID-19 mRNA Vac-TriS(Pfizer); Inject 0.3 mLs into the muscle once for 1 dose.  Dispense: 0.3 mL; Refill: 0     Return in about 6 months (around 10/16/2024) for physical.    Harlene Saddler, MD

## 2024-04-18 NOTE — Assessment & Plan Note (Signed)
 Lipid Panel     Component Value Date/Time   CHOL 172 10/03/2023 1221   TRIG 126 10/03/2023 1221   HDL 60 10/03/2023 1221   CHOLHDL 2.9 10/03/2023 1221   LDLCALC 90 10/03/2023 1221   LDLDIRECT 87 10/03/2023 1221   LABVLDL 22 10/03/2023 1221  No anginal symptoms. The 10-year ASCVD risk score (Arnett DK, et al., 2019) is: 3.6%. Currently on atorvastatin  10 mg daily. He is fasting today. Repeat lipid panel. If LDL remains >70 will increase statin.

## 2024-04-18 NOTE — Assessment & Plan Note (Addendum)
 Doing well with BiPAP. Continue with this.

## 2024-04-19 ENCOUNTER — Ambulatory Visit: Payer: Self-pay | Admitting: Student

## 2024-04-19 LAB — LIPID PANEL
Chol/HDL Ratio: 2.4 ratio (ref 0.0–5.0)
Cholesterol, Total: 142 mg/dL (ref 100–199)
HDL: 58 mg/dL (ref >39)
LDL Chol Calc (NIH): 71 mg/dL (ref 0–99)
Triglycerides: 60 mg/dL (ref 0–149)
VLDL Cholesterol Cal: 13 mg/dL (ref 5–40)

## 2024-04-19 LAB — CBC WITH DIFFERENTIAL/PLATELET
Basophils Absolute: 0 x10E3/uL (ref 0.0–0.2)
Basos: 1 %
EOS (ABSOLUTE): 0.2 x10E3/uL (ref 0.0–0.4)
Eos: 3 %
Hematocrit: 45 % (ref 37.5–51.0)
Hemoglobin: 14.5 g/dL (ref 13.0–17.7)
Immature Grans (Abs): 0 x10E3/uL (ref 0.0–0.1)
Immature Granulocytes: 0 %
Lymphocytes Absolute: 1.7 x10E3/uL (ref 0.7–3.1)
Lymphs: 34 %
MCH: 30.1 pg (ref 26.6–33.0)
MCHC: 32.2 g/dL (ref 31.5–35.7)
MCV: 93 fL (ref 79–97)
Monocytes Absolute: 0.5 x10E3/uL (ref 0.1–0.9)
Monocytes: 10 %
Neutrophils Absolute: 2.6 x10E3/uL (ref 1.4–7.0)
Neutrophils: 52 %
Platelets: 182 x10E3/uL (ref 150–450)
RBC: 4.82 x10E6/uL (ref 4.14–5.80)
RDW: 13.1 % (ref 11.6–15.4)
WBC: 5 x10E3/uL (ref 3.4–10.8)

## 2024-04-19 LAB — HEMOGLOBIN A1C
Est. average glucose Bld gHb Est-mCnc: 117 mg/dL
Hgb A1c MFr Bld: 5.7 % — ABNORMAL HIGH (ref 4.8–5.6)

## 2024-04-19 LAB — COMPREHENSIVE METABOLIC PANEL WITH GFR
ALT: 40 IU/L (ref 0–44)
AST: 30 IU/L (ref 0–40)
Albumin: 4.4 g/dL (ref 3.8–4.9)
Alkaline Phosphatase: 113 IU/L (ref 44–121)
BUN/Creatinine Ratio: 14 (ref 9–20)
BUN: 16 mg/dL (ref 6–24)
Bilirubin Total: 0.4 mg/dL (ref 0.0–1.2)
CO2: 22 mmol/L (ref 20–29)
Calcium: 9.4 mg/dL (ref 8.7–10.2)
Chloride: 104 mmol/L (ref 96–106)
Creatinine, Ser: 1.17 mg/dL (ref 0.76–1.27)
Globulin, Total: 2.7 g/dL (ref 1.5–4.5)
Glucose: 106 mg/dL — ABNORMAL HIGH (ref 70–99)
Potassium: 4.5 mmol/L (ref 3.5–5.2)
Sodium: 142 mmol/L (ref 134–144)
Total Protein: 7.1 g/dL (ref 6.0–8.5)
eGFR: 74 mL/min/1.73 (ref >59)

## 2024-05-07 ENCOUNTER — Encounter: Payer: Self-pay | Admitting: Student

## 2024-05-08 NOTE — Telephone Encounter (Signed)
 Please review patient's message:

## 2024-05-31 ENCOUNTER — Encounter: Payer: Self-pay | Admitting: Dietician

## 2024-05-31 ENCOUNTER — Encounter: Attending: Student | Admitting: Dietician

## 2024-05-31 VITALS — Ht 70.5 in | Wt 273.1 lb

## 2024-05-31 DIAGNOSIS — R7303 Prediabetes: Secondary | ICD-10-CM | POA: Insufficient documentation

## 2024-05-31 DIAGNOSIS — Z87442 Personal history of urinary calculi: Secondary | ICD-10-CM | POA: Diagnosis not present

## 2024-05-31 DIAGNOSIS — E66812 Obesity, class 2: Secondary | ICD-10-CM | POA: Insufficient documentation

## 2024-05-31 DIAGNOSIS — Z6837 Body mass index (BMI) 37.0-37.9, adult: Secondary | ICD-10-CM | POA: Insufficient documentation

## 2024-05-31 DIAGNOSIS — Z713 Dietary counseling and surveillance: Secondary | ICD-10-CM | POA: Insufficient documentation

## 2024-05-31 NOTE — Progress Notes (Signed)
 Medical Nutrition Therapy  Appointment Start time:  1400  Appointment End time:  1500  Primary concerns today: Weight Loss, Kidney stones  Referral diagnosis: E66.812,E66.01,Z68.37 (ICD-10-CM) - Class 2 severe obesity due to excess calories with serious comorbidity and body mass index (BMI) of 37.0 to 37.9 in adult  Preferred learning style: No preference indicated Learning readiness: Contemplating   NUTRITION ASSESSMENT    Clinical Medical Hx: CAD, NAFLD, OSA, Obesity, Prediabetes, Nephrolithiasis, Vit D deficiency, Constipation Medications: Atorvastatin , Docusate sodium, Fluvoxamine, Aripiprazole Labs (04/18/2024): A1c - 5.7%, Glucose - 106 Notable Signs/Symptoms: N/A   Lifestyle & Dietary Hx Pt reports goal of weight loss and reducing kidney stone formation. Pt reports recurrent kidney stones since mid-1990s, mostly R side, has had multiple lithotripsy procedures.  Pt reports refrigerator recently died, currently eating away from home ([employer], [place], [place], [employer] technical sales) once a day now, pt states they are unsure as to when it will be replaced. Pt reports usual dietary pattern would be small nibbles during the day and then heavy meals for dinner prior to fridge failing. Pt reports usually drinks one large 32 oz diet lemonade at work during the week, little plain water. 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, eats there twice a week, will make salads with grilled chicken, beets, mushrooms, lettuce, sunflower seeds, peppers, vinegarette   Estimated daily fluid intake: <64 oz Supplements: Fish Oil Sleep: OSA on BiPap, sleep well Stress / self-care: Moderate, occasional work stress Current average weekly physical activity: ADLs, no structured activity   24-Hr Dietary Recall First Meal (evening): [employer] Beef and swiss sandwich, chips, 1/2 sweet tea Snack:  Second Meal:  Snack:  Third Meal:   Snack:  Beverages: Diet lemonade, water, 1/2 sweet tea     NUTRITION DIAGNOSIS  NB-1.1 Food and nutrition-related knowledge deficit As related to obesity.  As evidenced by BMI of 38.63 kg/m2, irregular dietary pattern, no sturcutred physical activity.Richard Frost   NUTRITION INTERVENTION  Nutrition education (E-1) on the following topics:  Educated patient on the two components of energy balance: Energy in (calories), and energy out (activity). Explain the role of negative energy balance in weight loss. Discussed options with patient to achieve a negative energy balance and how to best control energy in and energy out to accommodate their lifestyle. Educated patient on dietary factors that can contribute to kidney stone formation, including high sodium foods, high oxalate foods, and the importance of hydration.   Handouts Provided Include  Balanced Plate Kidney Stones Nutrition Therapy  Learning Style & Readiness for Change Teaching method utilized: Visual & Auditory  Demonstrated degree of understanding via: Teach Back  Barriers to learning/adherence to lifestyle change: None  Goals Established by Pt Aim to drink 100 oz, or 3 L of fluids per days. Limit sodium consumption to under 2,000 mg per day. Limit fried foods, packaged/processed foods, and high sodium proteins (deli meats, cured meats, bacon, sausage) Try GoGoSqueez yogurts for a shelf stable snack with protein. Work towards eating three meals a day, about 5-6 hours apart! Begin to recognize carbohydrates, proteins, and non-starchy vegetables in your food choices! Begin to build your meals using the proportions of the Balanced Plate. First, select your carb choice(s) for the meal. Make this 25% of your meal. Next, select your source of protein to pair with your carb choice(s). Make this another 25% of your meal. Finally, complete your meal with a variety of  non-starchy vegetables. Make this the remaining 50% of your meal. When  eating away from home, split your meal with your daughter and order a side salad of side vegetables to balance the meal! When at work, get up and walk/move for ~10 minutes every 90 minutes!   MONITORING & EVALUATION Dietary intake, weekly physical activity, and weight change in 2 months.  Next Steps  Patient is to follow up with RD.

## 2024-05-31 NOTE — Patient Instructions (Addendum)
 Aim to drink 100 oz, or 3 L of fluids per days.  Limit sodium consumption to under 2,000 mg per day. Limit fried foods, packaged/processed foods, and high sodium proteins (deli meats, cured meats, bacon, sausage)  Try GoGoSqueez yogurts for a shelf stable snack with protein.  Work towards eating three meals a day, about 5-6 hours apart!  Begin to recognize carbohydrates, proteins, and non-starchy vegetables in your food choices!  Begin to build your meals using the proportions of the Balanced Plate. First, select your carb choice(s) for the meal. Make this 25% of your meal. Next, select your source of protein to pair with your carb choice(s). Make this another 25% of your meal. Finally, complete your meal with a variety of non-starchy vegetables. Make this the remaining 50% of your meal.  When eating away from home, split your meal with your daughter and order a side salad of side vegetables to balance the meal!  When at work, get up and walk/move for ~10 minutes every 90 minutes!

## 2024-08-05 ENCOUNTER — Encounter: Payer: Self-pay | Admitting: Family Medicine

## 2024-08-06 ENCOUNTER — Ambulatory Visit: Admitting: Family Medicine

## 2024-08-06 ENCOUNTER — Encounter: Payer: Self-pay | Admitting: Family Medicine

## 2024-08-06 VITALS — BP 96/58 | HR 91 | Ht 70.5 in | Wt 270.0 lb

## 2024-08-06 DIAGNOSIS — M76822 Posterior tibial tendinitis, left leg: Secondary | ICD-10-CM | POA: Insufficient documentation

## 2024-08-06 MED ORDER — DICLOFENAC SODIUM 50 MG PO TBEC: 50.0000 mg | DELAYED_RELEASE_TABLET | Freq: Two times a day (BID) | ORAL | 0 refills | Status: AC | PRN

## 2024-08-06 NOTE — Assessment & Plan Note (Signed)
 History of Present Illness Richard Frost is a 53 year old male who presents with left ankle pain and swelling following increased activity.  Left ankle pain - Pain localized to the anterior aspect of the left ankle - Throbbing quality - Onset shortly after increased ambulation during a recent trip - Associated with walking approximately 11,000 steps per day for two consecutive days - Occurs both with ambulation and at rest - Exacerbated by single-leg weight-bearing activities, such as standing on one foot or stepping over objects - Routine ambulation is not severely limited, but full weight-bearing on the left foot is uncomfortable - No prior history of similar symptoms or injury to either ankle or foot  Left ankle swelling - Swelling suspected but not definitively confirmed - Developed in conjunction with pain after increased activity  Functional impact - Sensation of instability with single-leg weight-bearing activities - Routine ambulation preserved, but discomfort with activities requiring full weight-bearing on the left foot  Footwear and support - Wears orthopedic shoes with insoles - Orthopedic shoes and insoles used during the trip  Right ankle and foot symptoms - No pain, swelling, or instability in the right ankle or foot  Prior treatments - No prior treatments or medications used for current symptoms  Physical Exam LEFT ANKLE INSPECTION: No deformity, erythema, or ecchymosis. Mild swelling noted along the medial aspect of the ankle. Normal alignment. PALPATION: Tenderness localized to the posterior tibialis tendon just distal to the medial malleolus at its insertion. Non-tender over the anterior ankle joint line, deltoid ligament, Achilles tendon, and lateral ligaments (ATFL, CFL, PTFL). No crepitus or bony tenderness. RANGE OF MOTION: Full active and passive dorsiflexion, plantarflexion, inversion, and eversion with mild discomfort during resisted  inversion. STRENGTH: 5/5 in dorsiflexion, plantarflexion, and eversion; mild pain-limited weakness during resisted inversion and single-leg heel raise. NEUROVASCULAR: Sensation intact to light touch throughout the foot and ankle; dorsalis pedis and posterior tibial pulses palpable. SPECIAL TESTS: Pain reproduced with single-leg heel raise test. Negative anterior drawer and talar tilt tests. No instability appreciated.  Assessment and Plan Posterior tibial tendinitis, left ankle Subacute left posterior tibial tendinitis due to overuse. No joint involvement or acute injury. Prognosis favorable with anti-inflammatory therapy and rehabilitation. - Prescribed oral diclofenac twice daily with food for pain and inflammation, transition to as-needed after one week based on symptom improvement. - Provided instructions for posterior tibialis tendon rehabilitation program: towel stretch, calf stretch, heel raises, step-ups, resisted ankle inversion, single-leg balance exercises. - Instructed to delay exercises until end of week to allow medication to reduce inflammation and pain. - Advised bilateral exercises to maintain balance and prevent compensatory issues. - Provided guidance: medication for symptom control, exercises essential for long-term resolution. - Instructed to follow up if symptoms persist beyond 1-2 weeks, pain recurs upon discontinuing medication, or unable to perform exercises; further management may include alternative medications, temporary immobilization, imaging, or physical therapy.

## 2024-08-06 NOTE — Progress Notes (Signed)
 "    Primary Care / Sports Medicine Office Visit  Patient Information:  Patient ID: Richard Frost, male DOB: Aug 03, 1971 Age: 53 y.o. MRN: [id number]   Richard Frost is a pleasant 53 y.o. male presenting with the following:  Chief Complaint  Patient presents with   Ankle Pain    Left ankle pain x 6 months. Pain comes and goes. Aggravating factors standing long periods, walking up stairs. Patient takes IBU and tylenol  for pain and it helps a little bit.     Vitals:   08/06/24 1602  BP: (!) 96/58  Pulse: 91  SpO2: 93%   Vitals:   08/06/24 1602  Weight: 270 lb (122.5 kg)  Height: 5' 10.5 (1.791 m)   Body mass index is 38.19 kg/m.  No results found.   Discussed the use of AI scribe software for clinical note transcription with the patient, who gave verbal consent to proceed.   Independent interpretation of notes and tests performed by another provider:   None  Procedures performed:   None  Pertinent History, Exam, Impression, and Recommendations:   Problem List Items Addressed This Visit     Posterior tibial tendinitis, left - Primary   History of Present Illness Richard Frost is a 53 year old male who presents with left ankle pain and swelling following increased activity.  Left ankle pain - Pain localized to the anterior aspect of the left ankle - Throbbing quality - Onset shortly after increased ambulation during a recent trip - Associated with walking approximately 11,000 steps per day for two consecutive days - Occurs both with ambulation and at rest - Exacerbated by single-leg weight-bearing activities, such as standing on one foot or stepping over objects - Routine ambulation is not severely limited, but full weight-bearing on the left foot is uncomfortable - No prior history of similar symptoms or injury to either ankle or foot  Left ankle swelling - Swelling suspected but not definitively confirmed - Developed in  conjunction with pain after increased activity  Functional impact - Sensation of instability with single-leg weight-bearing activities - Routine ambulation preserved, but discomfort with activities requiring full weight-bearing on the left foot  Footwear and support - Wears orthopedic shoes with insoles - Orthopedic shoes and insoles used during the trip  Right ankle and foot symptoms - No pain, swelling, or instability in the right ankle or foot  Prior treatments - No prior treatments or medications used for current symptoms  Physical Exam LEFT ANKLE INSPECTION: No deformity, erythema, or ecchymosis. Mild swelling noted along the medial aspect of the ankle. Normal alignment. PALPATION: Tenderness localized to the posterior tibialis tendon just distal to the medial malleolus at its insertion. Non-tender over the anterior ankle joint line, deltoid ligament, Achilles tendon, and lateral ligaments (ATFL, CFL, PTFL). No crepitus or bony tenderness. RANGE OF MOTION: Full active and passive dorsiflexion, plantarflexion, inversion, and eversion with mild discomfort during resisted inversion. STRENGTH: 5/5 in dorsiflexion, plantarflexion, and eversion; mild pain-limited weakness during resisted inversion and single-leg heel raise. NEUROVASCULAR: Sensation intact to light touch throughout the foot and ankle; dorsalis pedis and posterior tibial pulses palpable. SPECIAL TESTS: Pain reproduced with single-leg heel raise test. Negative anterior drawer and talar tilt tests. No instability appreciated.  Assessment and Plan Posterior tibial tendinitis, left ankle Subacute left posterior tibial tendinitis due to overuse. No joint involvement or acute injury. Prognosis favorable with anti-inflammatory therapy and rehabilitation. - Prescribed oral diclofenac twice daily with food for pain and  inflammation, transition to as-needed after one week based on symptom improvement. - Provided instructions for  posterior tibialis tendon rehabilitation program: towel stretch, calf stretch, heel raises, step-ups, resisted ankle inversion, single-leg balance exercises. - Instructed to delay exercises until end of week to allow medication to reduce inflammation and pain. - Advised bilateral exercises to maintain balance and prevent compensatory issues. - Provided guidance: medication for symptom control, exercises essential for long-term resolution. - Instructed to follow up if symptoms persist beyond 1-2 weeks, pain recurs upon discontinuing medication, or unable to perform exercises; further management may include alternative medications, temporary immobilization, imaging, or physical therapy.        Orders & Medications Medications:  Meds ordered this encounter  Medications   diclofenac (VOLTAREN) 50 MG EC tablet    Sig: Take 1 tablet (50 mg total) by mouth 2 (two) times daily as needed.    Dispense:  60 tablet    Refill:  0   No orders of the defined types were placed in this encounter.    No follow-ups on file.     Selinda JINNY Ku, MD, [facility]   Primary Care Sports Medicine Primary Care and Sports Medicine at [facility]   "

## 2024-08-06 NOTE — Patient Instructions (Signed)
 VISIT SUMMARY:  Today, you were seen for left ankle pain and swelling that started after increased activity during a recent trip. You have been diagnosed with subacute left posterior tibial tendinitis due to overuse.  YOUR PLAN:  LEFT ANKLE PAIN AND SWELLING: You have subacute left posterior tibial tendinitis due to overuse. This is causing pain and swelling in your left ankle. -Take oral diclofenac twice daily with food for pain and inflammation. After one week, you can switch to taking it as needed based on how your symptoms improve. -Start a posterior tibialis tendon rehabilitation program, which includes towel stretch, calf stretch, heel raises, step-ups, resisted ankle inversion, and single-leg balance exercises. Wait until the end of the week to start these exercises to allow the medication to reduce inflammation and pain. -Perform bilateral exercises to maintain balance and prevent compensatory issues. -Follow up if your symptoms persist beyond 1-2 weeks, if pain returns after stopping the medication, or if you are unable to perform the exercises. Further management may include alternative medications, temporary immobilization, imaging, or physical therapy.

## 2024-08-15 ENCOUNTER — Encounter: Admitting: Dietician

## 2024-09-07 ENCOUNTER — Ambulatory Visit: Payer: Self-pay

## 2024-09-07 ENCOUNTER — Telehealth: Admitting: Nurse Practitioner

## 2024-09-07 ENCOUNTER — Other Ambulatory Visit: Payer: Self-pay

## 2024-09-07 ENCOUNTER — Emergency Department

## 2024-09-07 ENCOUNTER — Encounter: Payer: Self-pay | Admitting: Student

## 2024-09-07 ENCOUNTER — Encounter: Admitting: Dietician

## 2024-09-07 ENCOUNTER — Emergency Department
Admission: EM | Admit: 2024-09-07 | Discharge: 2024-09-07 | Disposition: A | Discharge status: Home / Self Care | Attending: Emergency Medicine | Admitting: Emergency Medicine

## 2024-09-07 DIAGNOSIS — R002 Palpitations: Secondary | ICD-10-CM

## 2024-09-07 DIAGNOSIS — R000 Tachycardia, unspecified: Secondary | ICD-10-CM | POA: Diagnosis present

## 2024-09-07 DIAGNOSIS — I471 Supraventricular tachycardia, unspecified: Secondary | ICD-10-CM | POA: Diagnosis not present

## 2024-09-07 DIAGNOSIS — R0689 Other abnormalities of breathing: Secondary | ICD-10-CM | POA: Diagnosis not present

## 2024-09-07 LAB — COMPREHENSIVE METABOLIC PANEL WITH GFR
ALT: 46 U/L — ABNORMAL HIGH (ref 0–44)
AST: 43 U/L — ABNORMAL HIGH (ref 15–41)
Albumin: 4.3 g/dL (ref 3.5–5.0)
Alkaline Phosphatase: 104 U/L (ref 38–126)
Anion gap: 11 (ref 5–15)
BUN: 19 mg/dL (ref 6–20)
CO2: 24 mmol/L (ref 22–32)
Calcium: 9.3 mg/dL (ref 8.9–10.3)
Chloride: 102 mmol/L (ref 98–111)
Creatinine, Ser: 1.17 mg/dL (ref 0.61–1.24)
GFR, Estimated: >60 mL/min
Glucose, Bld: 117 mg/dL — ABNORMAL HIGH (ref 70–99)
Potassium: 4.4 mmol/L (ref 3.5–5.1)
Sodium: 138 mmol/L (ref 135–145)
Total Bilirubin: 0.6 mg/dL (ref 0.0–1.2)
Total Protein: 7.3 g/dL (ref 6.5–8.1)

## 2024-09-07 LAB — CBC WITH DIFFERENTIAL/PLATELET
Abs Immature Granulocytes: 0.01 10*3/uL (ref 0.00–0.07)
Basophils Absolute: 0 10*3/uL (ref 0.0–0.1)
Basophils Relative: 1 %
Eosinophils Absolute: 0.1 10*3/uL (ref 0.0–0.5)
Eosinophils Relative: 2 %
HCT: 44.9 % (ref 39.0–52.0)
Hemoglobin: 14.6 g/dL (ref 13.0–17.0)
Immature Granulocytes: 0 %
Lymphocytes Relative: 32 %
Lymphs Abs: 2 10*3/uL (ref 0.7–4.0)
MCH: 30.2 pg (ref 26.0–34.0)
MCHC: 32.5 g/dL (ref 30.0–36.0)
MCV: 93 fL (ref 80.0–100.0)
Monocytes Absolute: 0.5 10*3/uL (ref 0.1–1.0)
Monocytes Relative: 8 %
Neutro Abs: 3.7 10*3/uL (ref 1.7–7.7)
Neutrophils Relative %: 57 %
Platelets: 178 10*3/uL (ref 150–400)
RBC: 4.83 MIL/uL (ref 4.22–5.81)
RDW: 13 % (ref 11.5–15.5)
WBC: 6.4 10*3/uL (ref 4.0–10.5)
nRBC: 0 % (ref 0.0–0.2)

## 2024-09-07 LAB — TROPONIN T, HIGH SENSITIVITY
Troponin T High Sensitivity: 13 ng/L (ref 0–19)
Troponin T High Sensitivity: 22 ng/L — ABNORMAL HIGH (ref 0–19)

## 2024-09-07 LAB — MAGNESIUM: Magnesium: 2 mg/dL (ref 1.7–2.4)

## 2024-09-07 LAB — T4, FREE: Free T4: 1.09 ng/dL (ref 0.80–2.00)

## 2024-09-07 LAB — TSH: TSH: 2.78 u[IU]/mL (ref 0.350–4.500)

## 2024-09-07 LAB — PRO BRAIN NATRIURETIC PEPTIDE: Pro Brain Natriuretic Peptide: 154 pg/mL

## 2024-09-07 MED ORDER — METOPROLOL TARTRATE 25 MG PO TABS
25.0000 mg | ORAL_TABLET | Freq: Once | ORAL | Status: AC
  Administered 2024-09-07: 25 mg via ORAL
  Filled 2024-09-07: qty 1

## 2024-09-07 MED ORDER — ADENOSINE 6 MG/2ML IV SOLN: 6.0000 mg | Freq: Once | INTRAVENOUS | Status: DC

## 2024-09-07 MED ORDER — METOPROLOL TARTRATE 25 MG PO TABS: 25.0000 mg | ORAL_TABLET | Freq: Two times a day (BID) | ORAL | 0 refills | Status: AC

## 2024-09-07 NOTE — Discharge Instructions (Addendum)
 Take the medications to try to help keep your heart rate low to prevent this from happening again.  Please follow-up with cardiology.  If you not hear from them please call to make an appointment.  Return to the ER if you develop worsening symptoms, palpitations or any other concerns.

## 2024-09-07 NOTE — Progress Notes (Signed)
 Pt advised to seek in person evaluation at local ED given cardiac symptoms. Verbalized understanding.

## 2024-09-07 NOTE — Telephone Encounter (Signed)
 Pt went to ED.  KP

## 2024-09-07 NOTE — ED Provider Notes (Signed)
 "  [facility] Provider Note    None    (approximate)   History   No chief complaint on file.   HPI  Richard Frost is a 54 y.o. male with history of tachycardia, OCD, anxiety who comes in with concerns for palpitations patient denies any recent changes to his medications for OCD, anxiety.  Denies stopping any of them cold turkey.  He denies any alcohol, caffeine, cocaine, weight loss medications.  He reports that since yesterday he has had intermittent episodes of feeling his heart racing.  Typically last about 15 minutes and go away.  Reports that he tried to talk to his primary care doctor to get a follow-up appointment but will be seen till next week.  Reports had another episode that started around 1130 and that was not going away and so therefore he decided to come to the emergency room to be evaluated.  He denies any chest pain, shortness of breath, risk factors for PE, swelling in his legs or any other concerns.  Denies any abdominal pain.   Physical Exam   Triage Vital Signs: ED Triage Vitals  Encounter Vitals Group     BP      Girls Systolic BP Percentile      Girls Diastolic BP Percentile      Boys Systolic BP Percentile      Boys Diastolic BP Percentile      Pulse      Resp      Temp      Temp src      SpO2      Weight      Height      Head Circumference      Peak Flow      Pain Score      Pain Loc      Pain Education      Exclude from Growth Chart     Most recent vital signs: Vitals:   09/07/24 1158  BP: 125/74  Pulse: (!) 187  Resp: 14  Temp: 98 F (36.7 C)  SpO2: 98%     General: Awake, no distress.  CV:  Good peripheral perfusion. Tachycardic Resp:  Normal effort.  Abd:  No distention.  Soft and nontender Other:  No swelling in legs    ED Results / Procedures / Treatments   Labs (all labs ordered are listed, but only abnormal results are displayed) Labs Reviewed  COMPREHENSIVE METABOLIC PANEL WITH GFR -  Abnormal; Notable for the following components:      Result Value   Glucose, Bld 117 (*)    AST 43 (*)    ALT 46 (*)    All other components within normal limits  CBC WITH DIFFERENTIAL/PLATELET  PRO BRAIN NATRIURETIC PEPTIDE  MAGNESIUM  TSH  T4, FREE  TROPONIN T, HIGH SENSITIVITY  TROPONIN T, HIGH SENSITIVITY     EKG  My interpretation of EKG:  Supraventricular tachycardia with a rate of 191 without any ST elevation or T wave versions, normal intervals  Sinus tachycardia rate of is 86 without any ST elevation or T wave inversions, normal intervals  RADIOLOGY I have reviewed the xray personally and interpreted    PROCEDURES:  Critical Care performed: Yes, see critical care procedure note(s)  .Critical Care  Performed by: Ernest Ronal BRAVO, MD Authorized by: Ernest Ronal BRAVO, MD   Critical care provider statement:    Critical care time (minutes):  30   Critical care was necessary to  treat or prevent imminent or life-threatening deterioration of the following conditions:  Cardiac failure   Critical care was time spent personally by me on the following activities:  Development of treatment plan with patient or surrogate, discussions with consultants, evaluation of patient's response to treatment, examination of patient, ordering and review of laboratory studies, ordering and review of radiographic studies, ordering and performing treatments and interventions, pulse oximetry, re-evaluation of patient's condition and review of old charts Comments:     Patient in SVT immediately went to the room noting tachycardia and patient was able to perform a vagal maneuver with conversion into normal sinus .1-3 Lead EKG Interpretation  Performed by: Ernest Ronal BRAVO, MD Authorized by: Ernest Ronal BRAVO, MD     Interpretation: normal     ECG rate:  70   ECG rate assessment: normal     Rhythm: sinus rhythm     Ectopy: none     Conduction: normal      MEDICATIONS ORDERED IN ED: Medications   metoprolol  tartrate (LOPRESSOR ) tablet 25 mg (has no administration in time range)     IMPRESSION / MDM / ASSESSMENT AND PLAN / ED COURSE  I reviewed the triage vital signs and the nursing notes.   Patient's presentation is most consistent with acute presentation with potential threat to life or bodily function.   Patient comes in with concerns for palpitations EKG confirming SVT.  No known risk factors for this.  No symptoms suggest PE no chest pain to suggest ACS will proceed with blood work to further evaluate.  Patient's blood pressure is stable therefore given stable SVT vagal maneuvers were performed and patient was able to convert into sinus.  Patient will be given a dose of metoprolol  given has been going in and out and will continue to monitor him.  Your testing is normal.  CMP shows slightly elevated LFTs CBC reassuring troponin negative BNP normal mag normal thyroid normal--- pt has h/o Fatty liver.   Patient denies any Tylenol  use, IV drug use, alcohol use.  No pain in his upper abdomen.  1:45 PM reevaluated patient has been in normal sinus.  Discussed with patient need for follow-up outpatient with cardiology.  Discussed that when I can tell this looks more like SVT but symptoms do not have people wear a Holter monitor to make sure that there is no atrial fibrillation that would put him at higher risk for stroke.  Patient expressed understanding.  We discussed what to do if he developed recurrent palpitations.   3:26 PM repeat evaluation patient remains asymptomatic he said no additional episodes his troponin did slightly increase but he has no chest pain no shortness of breath most likely demand secondary to the SVT.  We discussed admission for cardiac observation versus going home patient felt comfortable with going home and will follow-up outpatient with cardiology.  We did discuss return precautions and at this time he feels comfortable discharge home     The patient is  on the cardiac monitor to evaluate for evidence of arrhythmia and/or significant heart rate changes.      FINAL CLINICAL IMPRESSION(S) / ED DIAGNOSES   Final diagnoses:  SVT (supraventricular tachycardia)     Rx / DC Orders   ED Discharge Orders          Ordered    metoprolol  tartrate (LOPRESSOR ) 25 MG tablet  2 times daily        09/07/24 1346    Ambulatory referral to Cardiology  09/07/24 1346             Note:  This document was prepared using Dragon voice recognition software and may include unintentional dictation errors.   Ernest Ronal BRAVO, MD 09/07/24 1545  "

## 2024-09-07 NOTE — Telephone Encounter (Signed)
 Please review.  KP

## 2024-09-07 NOTE — ED Triage Notes (Signed)
 Pt arrives vai POV from home for palpitations and states something doesn't feel right. HR in the 190s in triage.

## 2024-09-12 ENCOUNTER — Ambulatory Visit: Admitting: Student

## 2024-09-14 ENCOUNTER — Ambulatory Visit: Admitting: Physician Assistant

## 2024-10-03 ENCOUNTER — Ambulatory Visit: Admitting: Physician Assistant

## 2024-10-04 ENCOUNTER — Encounter: Admitting: Dietician

## 2024-10-16 ENCOUNTER — Encounter: Admitting: Student
